# Patient Record
Sex: Female | Born: 1970 | Race: White | Hispanic: No | Marital: Married | Smoking: Never smoker
Health system: Southern US, Community
[De-identification: ages and names within clinical notes are randomized; demographics above are authoritative.]

## PROBLEM LIST (undated history)

## (undated) DIAGNOSIS — F32A Depression, unspecified: Secondary | ICD-10-CM

## (undated) DIAGNOSIS — K449 Diaphragmatic hernia without obstruction or gangrene: Secondary | ICD-10-CM

## (undated) DIAGNOSIS — Z8719 Personal history of other diseases of the digestive system: Secondary | ICD-10-CM

## (undated) DIAGNOSIS — B001 Herpesviral vesicular dermatitis: Secondary | ICD-10-CM

## (undated) DIAGNOSIS — K222 Esophageal obstruction: Secondary | ICD-10-CM

## (undated) DIAGNOSIS — J189 Pneumonia, unspecified organism: Secondary | ICD-10-CM

## (undated) DIAGNOSIS — K219 Gastro-esophageal reflux disease without esophagitis: Secondary | ICD-10-CM

## (undated) DIAGNOSIS — T7840XA Allergy, unspecified, initial encounter: Secondary | ICD-10-CM

## (undated) DIAGNOSIS — Z9889 Other specified postprocedural states: Secondary | ICD-10-CM

## (undated) DIAGNOSIS — I10 Essential (primary) hypertension: Secondary | ICD-10-CM

## (undated) DIAGNOSIS — M1990 Unspecified osteoarthritis, unspecified site: Secondary | ICD-10-CM

## (undated) HISTORY — PX: KNEE ARTHROSCOPY: SHX127

## (undated) HISTORY — DX: Unspecified osteoarthritis, unspecified site: M19.90

## (undated) HISTORY — DX: Essential (primary) hypertension: I10

## (undated) HISTORY — DX: Diaphragmatic hernia without obstruction or gangrene: K44.9

## (undated) HISTORY — DX: Personal history of other diseases of the digestive system: Z87.19

## (undated) HISTORY — DX: Pneumonia, unspecified organism: J18.9

## (undated) HISTORY — DX: Allergy, unspecified, initial encounter: T78.40XA

## (undated) HISTORY — DX: Depression, unspecified: F32.A

## (undated) HISTORY — DX: Other specified postprocedural states: Z98.890

## (undated) HISTORY — DX: Gastro-esophageal reflux disease without esophagitis: K21.9

## (undated) HISTORY — DX: Esophageal obstruction: K22.2

## (undated) HISTORY — DX: Herpesviral vesicular dermatitis: B00.1

---

## 1996-10-09 HISTORY — PX: BLADDER REPAIR: SHX76

## 1997-10-09 HISTORY — PX: BREAST SURGERY: SHX581

## 1998-04-13 ENCOUNTER — Other Ambulatory Visit: Admission: RE | Admit: 1998-04-13 | Discharge: 1998-04-13 | Payer: Self-pay | Admitting: Obstetrics and Gynecology

## 1998-08-11 ENCOUNTER — Encounter: Payer: Self-pay | Admitting: Emergency Medicine

## 1998-08-11 ENCOUNTER — Observation Stay (HOSPITAL_COMMUNITY): Admission: EM | Admit: 1998-08-11 | Discharge: 1998-08-12 | Payer: Self-pay | Admitting: Emergency Medicine

## 1998-08-11 ENCOUNTER — Inpatient Hospital Stay (HOSPITAL_COMMUNITY): Admission: RE | Admit: 1998-08-11 | Discharge: 1998-08-11 | Payer: Self-pay | Admitting: Obstetrics

## 1998-09-07 ENCOUNTER — Inpatient Hospital Stay (HOSPITAL_COMMUNITY): Admission: RE | Admit: 1998-09-07 | Discharge: 1998-09-10 | Payer: Self-pay | Admitting: Obstetrics and Gynecology

## 1998-10-09 HISTORY — PX: OTHER SURGICAL HISTORY: SHX169

## 1998-10-18 ENCOUNTER — Other Ambulatory Visit: Admission: RE | Admit: 1998-10-18 | Discharge: 1998-10-18 | Payer: Self-pay | Admitting: Obstetrics and Gynecology

## 1999-08-18 ENCOUNTER — Emergency Department (HOSPITAL_COMMUNITY): Admission: EM | Admit: 1999-08-18 | Discharge: 1999-08-18 | Payer: Self-pay | Admitting: Emergency Medicine

## 1999-10-21 ENCOUNTER — Other Ambulatory Visit: Admission: RE | Admit: 1999-10-21 | Discharge: 1999-10-21 | Payer: Self-pay | Admitting: Obstetrics and Gynecology

## 2001-04-05 ENCOUNTER — Other Ambulatory Visit: Admission: RE | Admit: 2001-04-05 | Discharge: 2001-04-05 | Payer: Self-pay | Admitting: Obstetrics and Gynecology

## 2002-05-30 ENCOUNTER — Other Ambulatory Visit: Admission: RE | Admit: 2002-05-30 | Discharge: 2002-05-30 | Payer: Self-pay | Admitting: Obstetrics and Gynecology

## 2003-06-23 ENCOUNTER — Other Ambulatory Visit: Admission: RE | Admit: 2003-06-23 | Discharge: 2003-06-23 | Payer: Self-pay | Admitting: Obstetrics and Gynecology

## 2003-11-26 ENCOUNTER — Encounter: Admission: RE | Admit: 2003-11-26 | Discharge: 2003-11-26 | Payer: Self-pay | Admitting: Allergy and Immunology

## 2004-08-12 ENCOUNTER — Other Ambulatory Visit: Admission: RE | Admit: 2004-08-12 | Discharge: 2004-08-12 | Payer: Self-pay | Admitting: Obstetrics and Gynecology

## 2005-07-30 ENCOUNTER — Encounter: Admission: RE | Admit: 2005-07-30 | Discharge: 2005-07-30 | Payer: Self-pay | Admitting: *Deleted

## 2007-09-06 ENCOUNTER — Ambulatory Visit (HOSPITAL_COMMUNITY): Admission: RE | Admit: 2007-09-06 | Discharge: 2007-09-06 | Payer: Self-pay | Admitting: Obstetrics and Gynecology

## 2008-09-07 ENCOUNTER — Ambulatory Visit (HOSPITAL_COMMUNITY): Admission: RE | Admit: 2008-09-07 | Discharge: 2008-09-07 | Payer: Self-pay | Admitting: Family Medicine

## 2009-09-17 ENCOUNTER — Encounter (INDEPENDENT_AMBULATORY_CARE_PROVIDER_SITE_OTHER): Payer: Self-pay | Admitting: *Deleted

## 2009-11-08 ENCOUNTER — Ambulatory Visit: Payer: Self-pay | Admitting: Gastroenterology

## 2009-11-08 ENCOUNTER — Encounter (INDEPENDENT_AMBULATORY_CARE_PROVIDER_SITE_OTHER): Payer: Self-pay | Admitting: *Deleted

## 2009-11-08 DIAGNOSIS — R1319 Other dysphagia: Secondary | ICD-10-CM

## 2009-11-08 DIAGNOSIS — R142 Eructation: Secondary | ICD-10-CM

## 2009-11-08 DIAGNOSIS — R143 Flatulence: Secondary | ICD-10-CM

## 2009-11-08 DIAGNOSIS — K219 Gastro-esophageal reflux disease without esophagitis: Secondary | ICD-10-CM

## 2009-11-08 DIAGNOSIS — R141 Gas pain: Secondary | ICD-10-CM

## 2009-11-26 ENCOUNTER — Ambulatory Visit: Payer: Self-pay | Admitting: Gastroenterology

## 2009-12-01 ENCOUNTER — Encounter: Payer: Self-pay | Admitting: Gastroenterology

## 2010-11-08 NOTE — Procedures (Signed)
Summary: Upper Endoscopy w/DIL  Patient: [name] Note: All result statuses are Final unless otherwise noted.  Tests: (1) Upper Endoscopy w/DIL (UED)  UED Upper Endoscopy w/DIL                             DONE     [facility]     [address]     [place], [place]  [address]           ENDOSCOPY PROCEDURE REPORT           PATIENT:  Hannah, Estock  MR#:  [id number]     BIRTHDATE:  [birth year], 38 yrs. old  GENDER:  female           ENDOSCOPIST:  Judie Petit T. Russella Dar, MD, Clementeen Graham     REFERRING:  Harold Hedge, MD           PROCEDURE DATE:  11/26/2009     PROCEDURE:  EGD with dilatation over guidewire, and with biopsy     ASA CLASS:  Class I     INDICATIONS:  1) dysphagia  2) GERD           MEDICATIONS:  Fentanyl 75 mcg IV, Versed 9 mg IV, Benadryl 25 mg     IV     TOPICAL ANESTHETIC:  Exactacain Spray           DESCRIPTION OF PROCEDURE:   After the risks benefits and     alternatives of the procedure were thoroughly explained, informed     consent was obtained.  The [id number]-H180 [id number] endoscope was     introduced through the mouth and advanced to the second portion of     the duodenum, without limitations.  The instrument was slowly     withdrawn as the mucosa was carefully examined.     <<PROCEDUREIMAGES>>           A stricture was found in the distal esophagus. It was     circumferential and benign appearing. Multiple biopsies were     obtained and sent to pathology.  The esophagus and     gastroesophageal junction were otherwise normal in appearance. The     stomach was entered and closely examined. The pylorus, antrum,     angularis, and lesser curvature were well visualized, including a     retroflexed view of the cardia and fundus. A small hiatal hernia     was noted on retroflexed view. The stomach wall was normally     distensable. The scope passed easily through the pylorus into the     duodenum. The duodenal bulb was normal in appearance, as was the  postbulbar duodenum.  Dilation was then performed at the distal     esophagus:           1) Dilator:  Savary over guidewire  Sizes:  14, 15, 16     Resistance:  minimal  Heme:  yes, minimal amount on first two     dilators           COMPLICATIONS:  None           ENDOSCOPIC IMPRESSION:     1) Stricture in the distal esophagus     2) Small hiatal hernia           RECOMMENDATIONS:     1) await pathology results     2) anti-reflux regimen     3) continue  PPI     4) call office to schedule an office visit for  1 month     5) post dilation instructions           Nil Bolser T. Russella Dar, MD, Clementeen Graham           CC:  Harold Hedge, M.D.           n.     eSIGNED:   Venita Lick. Johneisha Broaden at 11/26/2009 02:48 PM           Hannah Buchanan, [id number]  Note: An exclamation mark (!) indicates a result that was not dispersed into the flowsheet. Document Creation Date: 11/26/2009 2:48 PM _______________________________________________________________________  (1) Order result status: Final Collection or observation date-time: 11/26/2009 14:42 Requested date-time:  Receipt date-time:  Reported date-time:  Referring Physician:   Ordering Physician: Claudette Head [phone]) Specimen Source:  Source: Hannah Buchanan Order Number: [id number] Lab site:

## 2010-11-08 NOTE — Assessment & Plan Note (Signed)
Summary: ABDOMINAL BLOATING-CH   History of Present Illness Visit Type: Initial Consult Primary GI MD: Elie Goody MD [facility] Primary Provider: Harold Hedge, MD Requesting Provider: Harold Hedge, MD Chief Complaint: abdominal bloating after eating with abdominal pain, trouble swallowing solids and liquids History of Present Illness:   This is a 40 year old female here today with her husband. She relates a 14 year history of frequent reflux symptoms with intermittent solid dysphagia. Her reflux symptoms have responded to the use of Nexium and recur when Nexium is stopped.  Her solid food dysphagia has been intermittent over many years and does not seem to have progressed in frequency, or severity. Over the past several months. She has had significant postprandial abdominal bloating and abdominal discomfort. She has not noted any medication and diet changes. She has mild constipation and occasional takes a laxative. Her constipation does not correlate with her abdominal bloating.   GI Review of Systems    Reports abdominal pain, bloating, dysphagia with solids, and  weight gain.     Location of  Abdominal pain: generalized.    Denies acid reflux, belching, chest pain, dysphagia with liquids, heartburn, loss of appetite, nausea, vomiting, vomiting blood, and  weight loss.      Reports constipation.     Denies anal fissure, black tarry stools, change in bowel habit, diarrhea, diverticulosis, fecal incontinence, heme positive stool, hemorrhoids, irritable bowel syndrome, jaundice, light color stool, liver problems, rectal bleeding, and  rectal pain.   Current Medications (verified): 1)  Claritin 10 Mg Tabs (Loratadine) .Marland Kitchen.. 1 Tablet By Mouth Once Daily 2)  Nexium 40 Mg Cpdr (Esomeprazole Magnesium) .Marland Kitchen.. 1 Capsule By Mouth Once Daily 3)  Stool Softener 100 Mg Caps (Docusate Sodium) .... Take As Needed 4)  Valtrex 500 Mg Tabs (Valacyclovir Hcl) .... Take As Needed 5)  Advil 200 Mg Tabs  (Ibuprofen) .... Take As Needed 6)  Robaxin 500 Mg Tabs (Methocarbamol) .... Take As Needed  Allergies (verified): No Known Drug Allergies  Past History:  Past Medical History: Migraine headaches Asthma Obesity GERD  Past Surgical History: Bladder Tack Mammoplasty  Family History: Family History of Uterine Cancer: Maternal Grandmother Family History of Esophageal Cancer:Maternal Uncle Thyroid Cancer-Maternal Aunt Family History of Colon Polyps: Father Family History of Diabetes:Father  Social History: Married  1 boy 1 girl Patient has never smoked.  Alcohol Use - yes Illicit Drug Use - no Daily Caffeine Use  1 perday  Review of Systems       The patient complains of allergy/sinus, back pain, fatigue, headaches-new, muscle pains/cramps, shortness of breath, urination changes/pain, and urine leakage.         The pertinent positives and negatives are noted as above and in the HPI. All other ROS were reviewed and were negative.   Vital Signs:  Patient profile:   40 year old female Height:      65 inches Weight:      231.50 pounds BMI:     38.66 Pulse rate:   76 / minute Pulse rhythm:   regular BP sitting:   116 / 72  (right arm)  Vitals Entered By: Milford Cage NCMA (November 08, 2009 3:18 PM)  Physical Exam  General:  Well developed, well nourished, no acute distress. obese.   Head:  Normocephalic and atraumatic. Eyes:  PERRLA, no icterus. Ears:  Normal auditory acuity. Mouth:  No deformity or lesions, dentition normal. Neck:  Supple; no masses or thyromegaly. Lungs:  Clear throughout to auscultation. Heart:  Regular rate and rhythm; no murmurs, rubs,  or bruits. Abdomen:  Soft, nontender and nondistended. No masses, hepatosplenomegaly or hernias noted. Normal bowel sounds. Msk:  Symmetrical with no gross deformities. Normal posture. Pulses:  Normal pulses noted. Extremities:  No clubbing, cyanosis, edema or deformities noted. Neurologic:  Alert and   oriented x4;  grossly normal neurologically. Skin:  Intact without significant lesions or rashes. Cervical Nodes:  No significant cervical adenopathy. Inguinal Nodes:  No significant inguinal adenopathy. Psych:  Alert and cooperative. Normal mood and affect.  Impression & Recommendations:  Problem # 1:  GERD (ICD-530.81) Chronic reflux symptoms, rule out esophagitis and Barrett's esophagus. The risks, benefits and alternatives to endoscopy with possible biopsy and possible dilation were discussed with the patient and they consent to proceed. The procedure will be scheduled electively. After completing her current supply of Nexium, begin omeprazole 40 mg q.a.m. Orders: EGD SAV (EGD SAV)  Problem # 2:  OTHER DYSPHAGIA (ICD-787.29) Intermittent solid food dysphagia. I suspect she has an esophageal stricture. Upper endoscopy with possible dilation as above. Orders: EGD SAV (EGD SAV)  Problem # 3:  FLATULENCE ERUCTATION AND GAS PAIN (ICD-787.3) Recurrent postprandial abdominal bloating. Etiology unclear. Further evaluation with upper endoscopy. Trial of a low gas diet and a daily probiotic. Consider evaluation for gastric paracentesis and cholelithiasis. Consider a trial of antibiotics for possible bacterial overgrowth. Orders: EGD SAV (EGD SAV)  Patient Instructions: 1)  Conscious Sedation brochure given.  2)  Upper Endoscopy with Dilatation brochure given.  3)  Pick up your prescription at your pharmacy.  4)  Avoid foods high in acid content ( tomatoes, citrus juices, spicy foods) . Avoid eating within 3 to 4 hours of lying down or before exercising. Do not over eat; try smaller more frequent meals. Elevate head of bed four inches when sleeping.  5)  Excessive Gas Diet handout given.  6)  Start Align one tablet by mouth once daily x 1 month.  7)  Copy sent to : Harold Hedge, MD 8)  The medication list was reviewed and reconciled.  All changed / newly prescribed medications were  explained.  A complete medication list was provided to the patient / caregiver.  Prescriptions: OMEPRAZOLE 40 MG CPDR (OMEPRAZOLE) one tablet by mouth once daily  #34 x 11   Entered by:   Christie Nottingham CMA (AAMA)   Authorized by:   Meryl Dare MD [facility]   Signed by:   Meryl Dare MD [facility] on 11/08/2009   Method used:   Electronically to        [facility] (retail)       [address]       [place], [place]  [address]       Ph: [phone]       Fax: [phone]   RxID:   [id number]

## 2010-11-08 NOTE — Letter (Signed)
Summary: EGD Instructions  [facility]  [address] [place], [place] [address]   Phone: [phone]  Fax: [phone]       Hannah Buchanan    1971-04-26    MRN: [id number]       Procedure Day /Date: Friday February 18th, 2011     Arrival Time:  1:00pm     Procedure Time: 2:00pm     Location of Procedure:                    _ x _ [facility] (4th Floor)    PREPARATION FOR ENDOSCOPY   On 11/26/09  THE DAY OF THE PROCEDURE:  1.   No solid foods, milk or milk products are allowed after midnight the night before your procedure.  2.   Do not drink anything colored red or purple.  Avoid juices with pulp.  No orange juice.  3.  You may drink clear liquids until 12:00noon, which is 2 hours before your procedure.                                                                                                CLEAR LIQUIDS INCLUDE: Water Jello Ice Popsicles Tea (sugar ok, no milk/cream) Powdered fruit flavored drinks Coffee (sugar ok, no milk/cream) Gatorade Juice: apple, white grape, white cranberry  Lemonade Clear bullion, consomm, broth Carbonated beverages (any kind) Strained chicken noodle soup Hard Candy   MEDICATION INSTRUCTIONS  Unless otherwise instructed, you should take regular prescription medications with a small sip of water as early as possible the morning of your procedure.          OTHER INSTRUCTIONS  You will need a responsible adult at least 41 years of age to accompany you and drive you home.   This person must remain in the waiting room during your procedure.  Wear loose fitting clothing that is easily removed.  Leave jewelry and other valuables at home.  However, you may wish to bring a book to read or an iPod/MP3 player to listen to music as you wait for your procedure to start.  Remove all body piercing jewelry and leave at home.  Total time from sign-in until discharge is approximately 2-3 hours.  You should go  home directly after your procedure and rest.  You can resume normal activities the day after your procedure.  The day of your procedure you should not:   Drive   Make legal decisions   Operate machinery   Drink alcohol   Return to work  You will receive specific instructions about eating, activities and medications before you leave.    The above instructions have been reviewed and explained to me by   Marchelle Folks.     I fully understand and can verbalize these instructions _____________________________ Date _________

## 2010-11-08 NOTE — Letter (Signed)
Summary: [facility] Biopsy Results  [facility]  [address] [place], [place] [address]   Phone: [phone]  Fax: [phone]        December 01, 2009 MRN: [id number]    [facility] [address] [place], [place]  [address]    Dear Ms. Amero,  I am pleased to inform you that the biopsies taken during your recent endoscopic examination did not show any evidence of cancer upon pathologic examination. The biopsies showed mild inflammation.   Continue with the treatment plan as outlined on the day of your      exam.  Please call us if you are having persistent problems or have questions about your condition that have not been fully answered at this time.  Sincerely,  Meryl Dare MD [facility]  This letter has been electronically signed by your physician.  Appended Document: Patient Notice-Endo Biopsy Results  Letter mailed 2.24.11

## 2010-11-30 ENCOUNTER — Encounter: Payer: Self-pay | Admitting: Gastroenterology

## 2010-12-06 NOTE — Miscellaneous (Signed)
Summary: Omeprazole refill  Clinical Lists Changes  Medications: Changed medication from OMEPRAZOLE 40 MG CPDR (OMEPRAZOLE) one tablet by mouth once daily to OMEPRAZOLE 40 MG CPDR (OMEPRAZOLE) one tablet by mouth once daily NEEDS OFFICE VISIT FOR ANY FURTHER REFILLS!! - Signed Rx of OMEPRAZOLE 40 MG CPDR (OMEPRAZOLE) one tablet by mouth once daily NEEDS OFFICE VISIT FOR ANY FURTHER REFILLS!!;  #30 x 0;  Signed;  Entered by: Christie Nottingham CMA (AAMA);  Authorized by: Meryl Dare MD [facility];  Method used: Electronically to [facility] [address], [place], [place], [place]  [address], Ph: [phone], Fax: [phone]    Prescriptions: OMEPRAZOLE 40 MG CPDR (OMEPRAZOLE) one tablet by mouth once daily NEEDS OFFICE VISIT FOR ANY FURTHER REFILLS!!  #30 x 0   Entered by:   Christie Nottingham CMA (AAMA)   Authorized by:   Meryl Dare MD [facility]   Signed by:   Christie Nottingham CMA (AAMA) on 11/30/2010   Method used:   Electronically to        [facility] (retail)       [address]       [place], [place]  [address]       Ph: [phone]       Fax: [phone]   RxID:   [id number]

## 2011-11-21 ENCOUNTER — Ambulatory Visit: Payer: BC Managed Care – PPO

## 2011-11-21 ENCOUNTER — Ambulatory Visit (INDEPENDENT_AMBULATORY_CARE_PROVIDER_SITE_OTHER): Payer: BC Managed Care – PPO | Admitting: Family Medicine

## 2011-11-21 VITALS — BP 146/90 | HR 93 | Temp 98.2°F | Resp 18 | Ht 64.0 in | Wt 238.8 lb

## 2011-11-21 DIAGNOSIS — R062 Wheezing: Secondary | ICD-10-CM

## 2011-11-21 DIAGNOSIS — J209 Acute bronchitis, unspecified: Secondary | ICD-10-CM

## 2011-11-21 DIAGNOSIS — R05 Cough: Secondary | ICD-10-CM

## 2011-11-21 DIAGNOSIS — J9801 Acute bronchospasm: Secondary | ICD-10-CM

## 2011-11-21 LAB — GLUCOSE, POCT (MANUAL RESULT ENTRY): POC Glucose: 86

## 2011-11-21 LAB — POCT CBC
Lymph, poc: 2.2 (ref 0.6–3.4)
MCH, POC: 28.9 pg (ref 27–31.2)
MCHC: 32.1 g/dL (ref 31.8–35.4)
MPV: 11.3 fL (ref 0–99.8)
POC Granulocyte: 3.5 (ref 2–6.9)
POC LYMPH PERCENT: 35.6 %L (ref 10–50)
POC MID %: 6.5 %M (ref 0–12)
RDW, POC: 14.3 %

## 2011-11-21 MED ORDER — PROMETHAZINE-DM 6.25-15 MG/5ML PO SYRP: ORAL_SOLUTION | ORAL | Status: DC

## 2011-11-21 MED ORDER — METHYLPREDNISOLONE ACETATE 80 MG/ML IJ SUSP
80.0000 mg | Freq: Once | INTRAMUSCULAR | Status: AC
  Administered 2011-11-21: 80 mg via INTRAMUSCULAR

## 2011-11-21 MED ORDER — AZITHROMYCIN 250 MG PO TABS: ORAL_TABLET | ORAL | Status: AC

## 2011-11-21 MED ORDER — ALBUTEROL SULFATE (2.5 MG/3ML) 0.083% IN NEBU
2.5000 mg | INHALATION_SOLUTION | Freq: Once | RESPIRATORY_TRACT | Status: AC
  Administered 2011-11-21: 2.5 mg via RESPIRATORY_TRACT

## 2011-11-21 MED ORDER — ALBUTEROL SULFATE HFA 108 (90 BASE) MCG/ACT IN AERS: 2.0000 | INHALATION_SPRAY | Freq: Four times a day (QID) | RESPIRATORY_TRACT | Status: DC | PRN

## 2011-11-21 NOTE — Patient Instructions (Addendum)
Fluids, rest.  If not improving recheck in 2 days, sooner if worse.  Advised bone density, calcium, and vit D.  PATIENT was advised to check BP out of office when well, and RTC if still high.

## 2011-11-21 NOTE — Progress Notes (Signed)
  Subjective:    Patient ID: Hannah Buchanan, female    DOB: 12-Feb-1971, 41 y.o.   MRN: [id number]  HPI  No primary provider on file.  41 y.o. y/o CF presents with Cough and wheezing  Onset 3 days ago Location  Chest and sinuses Duration 3-4 days Severity moderate Quality/Character-persisitent Exacerbating/Alleviating Factors Cleaning a moldy room made it worse. Associated Signs and Symptoms wheezing, fatigue. No h/o breathing problems.   Health Maintenance: Dr. Owens Shark   Review of Systems  All other systems reviewed and are negative.       Objective:   Physical Exam  Nursing note and vitals reviewed. Constitutional: She is oriented to person, place, and time. She appears well-developed and well-nourished.  HENT:  Head: Normocephalic and atraumatic.  Right Ear: External ear normal.  Left Ear: External ear normal.  Nose: Nose normal.  Mouth/Throat: Oropharynx is clear and moist. No oropharyngeal exudate.  Neck: Normal range of motion. Neck supple.  Cardiovascular: Normal rate, regular rhythm and intact distal pulses.  Exam reveals no gallop and no friction rub.   No murmur heard. Pulmonary/Chest: Effort normal. No respiratory distress. She has wheezes (on expiration). She has no rales.  Lymphadenopathy:    She has no cervical adenopathy.  Neurological: She is alert and oriented to person, place, and time.  Skin: Skin is warm and dry.         [facility] reading (PRIMARY) by  Dr. Perfecto Kingdom markings bilaterally with no infiltrate.  Kyphosis  Results for orders placed in visit on 11/21/11  POCT CBC      Component Value Range   WBC 6.1  4.6 - 10.2 (K/uL)   Lymph, poc 2.2  0.6 - 3.4    POC LYMPH PERCENT 35.6  10 - 50 (%L)   MID (cbc) 0.4  0 - 0.9    POC MID % 6.5  0 - 12 (%M)   POC Granulocyte 3.5  2 - 6.9    Granulocyte percent 57.9  37 - 80 (%G)   RBC 4.71  4.04 - 5.48 (M/uL)   Hemoglobin 13.6  12.2 - 16.2 (g/dL)   HCT, POC 40.9  81.1 - 47.9 (%)   MCV 90.0   80 - 97 (fL)   MCH, POC 28.9  27 - 31.2 (pg)   MCHC 32.1  31.8 - 35.4 (g/dL)   RDW, POC 91.4     Platelet Count, POC 192  142 - 424 (K/uL)   MPV 11.3  0 - 99.8 (fL)  GLUCOSE, POCT (MANUAL RESULT ENTRY)      Component Value Range   POC Glucose 86      Assessment & Plan:  URI:  Possibly viral, but will cover for atypicals due to congestion on xray

## 2012-02-20 ENCOUNTER — Ambulatory Visit: Payer: BC Managed Care – PPO

## 2012-02-20 ENCOUNTER — Ambulatory Visit: Payer: Self-pay | Admitting: Family Medicine

## 2012-02-20 DIAGNOSIS — R079 Chest pain, unspecified: Secondary | ICD-10-CM

## 2012-02-20 NOTE — Progress Notes (Signed)
  Subjective:    Patient ID: Hannah Buchanan, female    DOB: 1971/01/16, 41 y.o.   MRN: [id number]  HPI 41 yo female with allergies, asthma, and GERD here for chest pain for 3 days.  Sharp/burning pain under left breast, left upper chest, left neck.  Also, feeling of pressure across upper chest bilaterally.  Pains improve some with aspirin.  Worse with bending over.   Today woke up with bad headache that made her nauseated.  Took aspirin again and headache abated to tolerable level and nausea went away.    Has been trying to increase exercise lately and notices that her heart starts to beat very fast and very hard and she feels short of breath.  Feels this is because she is obese.  No true pain or pressure with the exercising.   Took BP at work today and was 150/100.     Review of Systems Negative except as per HPI     Objective:   Physical Exam  Constitutional: Vital signs are normal. She appears well-developed and well-nourished. She is active.  Cardiovascular: Normal rate, regular rhythm, normal heart sounds and normal pulses.   Pulmonary/Chest: Effort normal and breath sounds normal.  Abdominal: Soft. Normal appearance and bowel sounds are normal. She exhibits no distension and no mass. There is no hepatosplenomegaly. There is no tenderness. There is no rigidity, no rebound, no guarding, no CVA tenderness, no tenderness at McBurney's point and negative Murphy's sign. No hernia.  Neurological: She is alert.    EKG: NSR, normal intervals, normal St-T waves      Assessment & Plan:  Chest pain - atypical but still somewhat concerning.  Warrants cardiology eval this week.  Discussed signs and symptoms that should prompt her to go right to ED.  Send to Dr. Jacinto Halim.

## 2012-12-17 ENCOUNTER — Encounter: Payer: Self-pay | Admitting: Gastroenterology

## 2013-01-13 ENCOUNTER — Encounter: Payer: Self-pay | Admitting: Gastroenterology

## 2013-01-13 ENCOUNTER — Other Ambulatory Visit (INDEPENDENT_AMBULATORY_CARE_PROVIDER_SITE_OTHER): Payer: BC Managed Care – PPO

## 2013-01-13 ENCOUNTER — Ambulatory Visit (INDEPENDENT_AMBULATORY_CARE_PROVIDER_SITE_OTHER): Payer: BC Managed Care – PPO | Admitting: Gastroenterology

## 2013-01-13 VITALS — BP 106/80 | HR 88 | Ht 64.0 in | Wt 246.2 lb

## 2013-01-13 DIAGNOSIS — K219 Gastro-esophageal reflux disease without esophagitis: Secondary | ICD-10-CM

## 2013-01-13 DIAGNOSIS — R7401 Elevation of levels of liver transaminase levels: Secondary | ICD-10-CM

## 2013-01-13 LAB — HEPATIC FUNCTION PANEL
Albumin: 4.2 g/dL (ref 3.5–5.2)
Alkaline Phosphatase: 78 U/L (ref 39–117)

## 2013-01-13 LAB — PROTIME-INR: Prothrombin Time: 12.9 s — ABNORMAL HIGH (ref 10.2–12.4)

## 2013-01-13 NOTE — Progress Notes (Signed)
History of Present Illness: This is a 42 year old female who has GERD with a history of a peptic stricture, dilated in 2011. She states her reflux symptoms are controlled with dietary measures and omeprazole 20 or 40 mg daily. She has occasional episodes of mild dysphagia with bread and meats but her symptoms are minimal and generally well controlled with drinking water with meals. Routine blood work by her cardiologist in 11/2012 revealed an mild elevation of her transaminases. AST of 67 and ALT at 83. Her glucose was mildly elevated at 107 cholesterol 201 LDL cholesterol 132. She has no prior history of liver disease. There is no family history of liver disease. She denies jaundice or exposure to individuals with hepatitis. She states she had a patient by her [facility] 15 or 20 years ago and evaluation at that time showed no evidence of hepatitis or any disease communicated to her. She was taking Naprosyn on a daily basis for several weeks prior to this blood work and discontinued Naprosyn after the blood work abnormalities were found. She has taken Aleve and ibuprofen infrequently since that time.  Current Medications, Allergies, Past Medical History, Past Surgical History, Family History and Social History were reviewed in [employer] record.  Physical Exam: General: Well developed , well nourished, obese, no acute distress Head: Normocephalic and atraumatic Eyes:  sclerae anicteric, EOMI Ears: Normal auditory acuity Mouth: No deformity or lesions Lungs: Clear throughout to auscultation Heart: Regular rate and rhythm; no murmurs, rubs or bruits Abdomen: Soft, non tender and non distended. No masses, hepatosplenomegaly or hernias noted. Normal Bowel sounds Musculoskeletal: Symmetrical with no gross deformities  Pulses:  Normal pulses noted Extremities: No clubbing, cyanosis, edema or deformities noted Neurological: Alert oriented x 4, grossly  nonfocal Psychological:  Alert and cooperative. Normal mood and affect  Assessment and Recommendations:  1. GERD with a history of an esophageal stricture. She notes intermittent dysphagia but is not ready to proceed with repeat dilation at this time. If her dysphagia worsens she is advised to contact us. Continue standard antireflux measures and omeprazole 20 mg daily to twice a day.  2. Mildly elevated transaminases. Rule out fatty infiltration of the liver, medication side effects. R/O genetic, acquired, and viral liver diseases. Standard blood work including repeat liver function test. Schedule abdominal ultrasound.  3. Colorectal cancer screening, average risk. Colonoscopy at age 25.

## 2013-01-13 NOTE — Patient Instructions (Addendum)
Your physician has requested that you go to the basement for lab work before leaving today.  You have been scheduled for an abdominal ultrasound at [facility] (1st floor of hospital) on 01/16/13 at 8:30am. Please arrive 15 minutes prior to your appointment for registration. Make certain not to have anything to eat or drink 6 hours prior to your appointment. Should you need to reschedule your appointment, please contact radiology at [phone]. This test typically takes about 30 minutes to perform.  cc: Delrae Rend, MD

## 2013-01-14 LAB — ALPHA-1-ANTITRYPSIN: A-1 Antitrypsin, Ser: 141 mg/dL (ref 90–200)

## 2013-01-14 LAB — CERULOPLASMIN: Ceruloplasmin: 34 mg/dL (ref 20–60)

## 2013-01-15 LAB — MITOCHONDRIAL ANTIBODIES: Mitochondrial M2 Ab, IgG: 0.4 (ref <0.91)

## 2013-01-16 ENCOUNTER — Other Ambulatory Visit: Payer: Self-pay | Admitting: Obstetrics and Gynecology

## 2013-01-16 ENCOUNTER — Other Ambulatory Visit: Payer: Self-pay

## 2013-01-16 ENCOUNTER — Ambulatory Visit (HOSPITAL_COMMUNITY)
Admission: RE | Admit: 2013-01-16 | Discharge: 2013-01-16 | Disposition: A | Discharge status: Home / Self Care | Payer: BC Managed Care – PPO | Source: Ambulatory Visit | Attending: Gastroenterology | Admitting: Gastroenterology

## 2013-01-16 DIAGNOSIS — K7689 Other specified diseases of liver: Secondary | ICD-10-CM | POA: Insufficient documentation

## 2013-01-16 DIAGNOSIS — R748 Abnormal levels of other serum enzymes: Secondary | ICD-10-CM | POA: Insufficient documentation

## 2013-01-16 DIAGNOSIS — R933 Abnormal findings on diagnostic imaging of other parts of digestive tract: Secondary | ICD-10-CM

## 2013-01-23 ENCOUNTER — Telehealth: Payer: Self-pay

## 2013-01-23 ENCOUNTER — Ambulatory Visit
Admission: RE | Admit: 2013-01-23 | Discharge: 2013-01-23 | Disposition: A | Discharge status: Home / Self Care | Payer: BC Managed Care – PPO | Source: Ambulatory Visit | Attending: Gastroenterology | Admitting: Gastroenterology

## 2013-01-23 DIAGNOSIS — R933 Abnormal findings on diagnostic imaging of other parts of digestive tract: Secondary | ICD-10-CM

## 2013-01-23 MED ORDER — GADOXETATE DISODIUM 0.25 MMOL/ML IV SOLN
10.0000 mL | Freq: Once | INTRAVENOUS | Status: AC | PRN
  Administered 2013-01-23: 10 mL via INTRAVENOUS

## 2013-01-23 NOTE — Telephone Encounter (Signed)
Pt is calling back because she has been speaking to someone about Medical Records and a provider. Call back number is [phone]

## 2013-01-23 NOTE — Telephone Encounter (Signed)
Called her, she is having some medical problems, and wishes to establish with Dr Katrinka Blazing or Dr Patsy Lager. I have provided hours for her, when you get medical records for her, please place in my box, so I can get them to Dr Patsy Lager or to Dr Katrinka Blazing.

## 2013-02-04 NOTE — Telephone Encounter (Signed)
Spoke with patient. She is seeing a cardiologist Dr. Jacinto Halim and Dr. Russella Dar at [facility]. She was told by her providers that she needed to list someone as her PCP so she told them Dr. Katrinka Blazing. She just wanted [place] to know in case they send her records. Told her that Dr. Ardell Isaacs office and our office are on the same EMR system but will look out for records from Dr. Jacinto Halim in case they send anything.

## 2013-08-14 ENCOUNTER — Other Ambulatory Visit: Payer: Self-pay

## 2013-11-03 ENCOUNTER — Emergency Department (HOSPITAL_COMMUNITY): Payer: Managed Care, Other (non HMO)

## 2013-11-03 ENCOUNTER — Encounter (HOSPITAL_COMMUNITY): Payer: Self-pay | Admitting: Emergency Medicine

## 2013-11-03 ENCOUNTER — Emergency Department (HOSPITAL_COMMUNITY)
Admission: EM | Admit: 2013-11-03 | Discharge: 2013-11-03 | Disposition: A | Discharge status: Home / Self Care | Payer: Managed Care, Other (non HMO) | Attending: Emergency Medicine | Admitting: Emergency Medicine

## 2013-11-03 DIAGNOSIS — Y92410 Unspecified street and highway as the place of occurrence of the external cause: Secondary | ICD-10-CM | POA: Insufficient documentation

## 2013-11-03 DIAGNOSIS — K219 Gastro-esophageal reflux disease without esophagitis: Secondary | ICD-10-CM | POA: Insufficient documentation

## 2013-11-03 DIAGNOSIS — R071 Chest pain on breathing: Secondary | ICD-10-CM | POA: Insufficient documentation

## 2013-11-03 DIAGNOSIS — Y9389 Activity, other specified: Secondary | ICD-10-CM | POA: Insufficient documentation

## 2013-11-03 DIAGNOSIS — I10 Essential (primary) hypertension: Secondary | ICD-10-CM | POA: Insufficient documentation

## 2013-11-03 DIAGNOSIS — IMO0002 Reserved for concepts with insufficient information to code with codable children: Secondary | ICD-10-CM | POA: Insufficient documentation

## 2013-11-03 DIAGNOSIS — Z79899 Other long term (current) drug therapy: Secondary | ICD-10-CM | POA: Insufficient documentation

## 2013-11-03 DIAGNOSIS — Z791 Long term (current) use of non-steroidal anti-inflammatories (NSAID): Secondary | ICD-10-CM | POA: Insufficient documentation

## 2013-11-03 DIAGNOSIS — R0789 Other chest pain: Secondary | ICD-10-CM

## 2013-11-03 MED ORDER — HYDROCODONE-ACETAMINOPHEN 5-325 MG PO TABS
1.0000 | ORAL_TABLET | Freq: Once | ORAL | Status: AC
Start: 2013-11-03 — End: 2013-11-03
  Administered 2013-11-03: 1 via ORAL
  Filled 2013-11-03: qty 1

## 2013-11-03 MED ORDER — KETOROLAC TROMETHAMINE 30 MG/ML IJ SOLN
30.0000 mg | Freq: Once | INTRAMUSCULAR | Status: AC
  Administered 2013-11-03: 30 mg via INTRAVENOUS
  Filled 2013-11-03: qty 1

## 2013-11-03 MED ORDER — HYDROCODONE-ACETAMINOPHEN 5-325 MG PO TABS: 1.0000 | ORAL_TABLET | Freq: Four times a day (QID) | ORAL | Status: DC | PRN

## 2013-11-03 MED ORDER — IBUPROFEN 600 MG PO TABS: 600.0000 mg | ORAL_TABLET | Freq: Four times a day (QID) | ORAL | Status: DC | PRN
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
## 2013-11-03 NOTE — ED Notes (Signed)
Pt ambulating in room without difficulty; drinking water
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
## 2013-11-03 NOTE — ED Notes (Signed)
Bed: [id number] Expected date:  Expected time:  Means of arrival:  Comments: ems
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
## 2013-11-11 ENCOUNTER — Ambulatory Visit: Payer: Managed Care, Other (non HMO)

## 2013-11-11 ENCOUNTER — Ambulatory Visit (INDEPENDENT_AMBULATORY_CARE_PROVIDER_SITE_OTHER): Payer: Managed Care, Other (non HMO) | Admitting: Emergency Medicine

## 2013-11-11 ENCOUNTER — Inpatient Hospital Stay
Admission: RE | Admit: 2013-11-11 | Discharge: 2013-11-11 | Disposition: A | Discharge status: Home / Self Care | Payer: Self-pay | Source: Ambulatory Visit | Attending: Emergency Medicine | Admitting: Emergency Medicine

## 2013-11-11 VITALS — BP 134/80 | HR 73 | Temp 98.2°F | Resp 18 | Ht 64.0 in | Wt 237.0 lb

## 2013-11-11 DIAGNOSIS — R079 Chest pain, unspecified: Secondary | ICD-10-CM

## 2013-11-11 DIAGNOSIS — S0510XA Contusion of eyeball and orbital tissues, unspecified eye, initial encounter: Secondary | ICD-10-CM

## 2013-11-11 DIAGNOSIS — T148XXA Other injury of unspecified body region, initial encounter: Secondary | ICD-10-CM

## 2013-11-11 DIAGNOSIS — S20219A Contusion of unspecified front wall of thorax, initial encounter: Secondary | ICD-10-CM

## 2013-11-11 NOTE — Patient Instructions (Signed)
Rib Contusion A rib contusion (bruise) can occur by a blow to the chest or by a fall against a hard object. Usually these will be much better in a couple weeks. If X-rays were taken today and there are no broken bones (fractures), the diagnosis of bruising is made. However, broken ribs may not show up for several days, or may be discovered later on a routine X-ray when signs of healing show up. If this happens to you, it does not mean that something was missed on the X-ray, but simply that it did not show up on the first X-rays. Earlier diagnosis will not usually change the treatment. HOME CARE INSTRUCTIONS   Avoid strenuous activity. Be careful during activities and avoid bumping the injured ribs. Activities that pull on the injured ribs and cause pain should be avoided, if possible.  For the first day or two, an ice pack used every 20 minutes while awake may be helpful. Put ice in a plastic bag and put a towel between the bag and the skin.  Eat a normal, well-balanced diet. Drink plenty of fluids to avoid constipation.  Take deep breaths several times a day to keep lungs free of infection. Try to cough several times a day. Splint the injured area with a pillow while coughing to ease pain. Coughing can help prevent pneumonia.  Wear a rib belt or binder only if told to do so by your caregiver. If you are wearing a rib belt or binder, you must do the breathing exercises as directed by your caregiver. If not used properly, rib belts or binders restrict breathing which can lead to pneumonia.  Only take over-the-counter or prescription medicines for pain, discomfort, or fever as directed by your caregiver. SEEK MEDICAL CARE IF:   You or your child has an oral temperature above 102 F (38.9 C).  Your baby is older than 3 months with a rectal temperature of 100.5 F (38.1 C) or higher for more than 1 day.  You develop a cough, with thick or bloody sputum. SEEK IMMEDIATE MEDICAL CARE IF:   You  have difficulty breathing.  You feel sick to your stomach (nausea), have vomiting or belly (abdominal) pain.  You have worsening pain, not controlled with medications, or there is a change in the location of the pain.  You develop sweating or radiation of the pain into the arms, jaw or shoulders, or become light headed or faint.  You or your child has an oral temperature above 102 F (38.9 C), not controlled by medicine.  Your or your baby is older than 3 months with a rectal temperature of 102 F (38.9 C) or higher.  Your baby is 3 months old or younger with a rectal temperature of 100.4 F (38 C) or higher. MAKE SURE YOU:   Understand these instructions.  Will watch your condition.  Will get help right away if you are not doing well or get worse. Document Released: 06/20/2001 Document Revised: 01/20/2013 Document Reviewed: 05/13/2008 ExitCare Patient Information 2014 [employer].  
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
## 2013-12-09 ENCOUNTER — Ambulatory Visit (INDEPENDENT_AMBULATORY_CARE_PROVIDER_SITE_OTHER): Payer: Managed Care, Other (non HMO) | Admitting: Emergency Medicine

## 2013-12-09 VITALS — BP 108/80 | HR 91 | Temp 99.1°F | Resp 18 | Wt 237.0 lb

## 2013-12-09 DIAGNOSIS — J209 Acute bronchitis, unspecified: Secondary | ICD-10-CM

## 2013-12-09 DIAGNOSIS — J018 Other acute sinusitis: Secondary | ICD-10-CM

## 2013-12-09 MED ORDER — PSEUDOEPHEDRINE-GUAIFENESIN ER 60-600 MG PO TB12: 1.0000 | ORAL_TABLET | Freq: Two times a day (BID) | ORAL | Status: DC

## 2013-12-09 MED ORDER — PROMETHAZINE-CODEINE 6.25-10 MG/5ML PO SYRP: 5.0000 mL | ORAL_SOLUTION | Freq: Four times a day (QID) | ORAL | Status: DC | PRN

## 2013-12-09 MED ORDER — AMOXICILLIN-POT CLAVULANATE 875-125 MG PO TABS: 1.0000 | ORAL_TABLET | Freq: Two times a day (BID) | ORAL | Status: DC

## 2013-12-09 NOTE — Patient Instructions (Signed)

## 2013-12-09 NOTE — Progress Notes (Signed)
[facility] [address], [place] [address] [phone]  Date:  12/09/2013   Name:  Hannah Buchanan   DOB:  12/27/1970   MRN:  [id number]  PCP:  Nilda SimmerSMITH,KRISTI, MD    Chief Complaint: chest congestion   History of Present Illness:  Hannah Buchanan is a 43 y.o. very pleasant female patient who presents with the following:  Ill with nasal congestion and post nasal drainage purulent in nature.  Sore throat.  Pressure in cheeks.  Has cough productive purulent sputum associated with some wheezing.  No shortness of breath.  No fever or chills.  History of bronchospasm.  No improvement with over the counter medications or other home remedies. Denies other complaint or health concern today.   Patient Active Problem List   Diagnosis Date Noted  . GERD 11/08/2009  . OTHER DYSPHAGIA 11/08/2009  . FLATULENCE ERUCTATION AND GAS PAIN 11/08/2009    Past Medical History  Diagnosis Date  . GERD (gastroesophageal reflux disease)   . Hypertension   . Status post dilation of esophageal narrowing   . Pneumonia   . Recurrent cold sores     Past Surgical History  Procedure Laterality Date  . Bladder repair  1998  . Breast surgery  1999    breast reduction    History  Substance Use Topics  . Smoking status: Never Smoker   . Smokeless tobacco: Never Used  . Alcohol Use: Yes     Comment: occasional    Family History  Problem Relation Age of Onset  . Uterine cancer Maternal Grandmother   . Thyroid cancer Maternal Aunt   . Cancer Maternal Uncle     unknown origin-Mets  . Colon cancer Father   . Colon polyps Father   . Diabetes Father     No Known Allergies  Medication list has been reviewed and updated.  Current Outpatient Prescriptions on File Prior to Visit  Medication Sig Dispense Refill  . albuterol (PROVENTIL HFA;VENTOLIN HFA) 108 (90 BASE) MCG/ACT inhaler Inhale 2 puffs into the lungs every 6 (six) hours as needed for wheezing.  1 Inhaler  0  .  chlorthalidone (HYGROTON) 25 MG tablet Take 25 mg by mouth as needed.      Marland Kitchen. HYDROcodone-acetaminophen (NORCO/VICODIN) 5-325 MG per tablet Take 1 tablet by mouth every 6 (six) hours as needed for moderate pain.  15 tablet  0  . ibuprofen (ADVIL,MOTRIN) 600 MG tablet Take 1 tablet (600 mg total) by mouth every 6 (six) hours as needed.  30 tablet  0  . loratadine (CLARITIN) 10 MG tablet Take 10 mg by mouth daily.      . metoprolol (LOPRESSOR) 50 MG tablet Take 50 mg by mouth 2 (two) times daily.      . naproxen sodium (ANAPROX) 220 MG tablet Take 440 mg by mouth 2 (two) times daily with a meal.      . nitroGLYCERIN (NITROSTAT) 0.4 MG SL tablet Place 0.4 mg under the tongue every 5 (five) minutes as needed for chest pain.      Marland Kitchen. omeprazole (PRILOSEC) 20 MG capsule Take 20 mg by mouth daily.      . valACYclovir (VALTREX) 500 MG tablet Take 500 mg by mouth as needed.       No current facility-administered medications on file prior to visit.    Review of Systems:  As per HPI, otherwise negative.    Physical Examination: Filed Vitals:   12/09/13 1828  BP:  108/80  Pulse: 91  Temp: 99.1 F (37.3 C)  Resp: 18   Filed Vitals:   12/09/13 1828  Weight: 237 lb (107.502 kg)   Body mass index is 40.66 kg/(m^2). Ideal Body Weight:    GEN: WDWN, NAD, Non-toxic, A & O x 3 HEENT: Atraumatic, Normocephalic. Neck supple. No masses, No LAD. Ears and Nose: No external deformity. CV: RRR, No M/G/R. No JVD. No thrill. No extra heart sounds. PULM: CTA B, no wheezes, crackles, rhonchi. No retractions. No resp. distress. No accessory muscle use. ABD: S, NT, ND, +BS. No rebound. No HSM. EXTR: No c/c/e NEURO Normal gait.  PSYCH: Normally interactive. Conversant. Not depressed or anxious appearing.  Calm demeanor.    Assessment and Plan: Sinusitis Bronchitis augmentin mucinex d Phen c cod Use your MDI  Signed,  Phillips Odor, MD

## 2013-12-11 ENCOUNTER — Other Ambulatory Visit: Payer: Self-pay | Admitting: Physician Assistant

## 2013-12-12 MED ORDER — ALBUTEROL SULFATE HFA 108 (90 BASE) MCG/ACT IN AERS: 2.0000 | INHALATION_SPRAY | Freq: Four times a day (QID) | RESPIRATORY_TRACT | Status: DC | PRN

## 2014-05-14 DIAGNOSIS — Z0271 Encounter for disability determination: Secondary | ICD-10-CM

## 2014-05-18 ENCOUNTER — Encounter: Payer: Self-pay | Admitting: Gastroenterology

## 2014-10-19 ENCOUNTER — Ambulatory Visit: Payer: Managed Care, Other (non HMO) | Admitting: Family Medicine

## 2014-10-26 ENCOUNTER — Ambulatory Visit: Payer: Managed Care, Other (non HMO) | Admitting: Family Medicine

## 2014-11-18 ENCOUNTER — Ambulatory Visit (INDEPENDENT_AMBULATORY_CARE_PROVIDER_SITE_OTHER): Payer: Managed Care, Other (non HMO) | Admitting: Family Medicine

## 2014-11-18 ENCOUNTER — Encounter: Payer: Self-pay | Admitting: Family Medicine

## 2014-11-18 VITALS — BP 120/78 | HR 97 | Temp 98.2°F | Resp 16 | Ht 64.0 in | Wt 235.0 lb

## 2014-11-18 DIAGNOSIS — B372 Candidiasis of skin and nail: Secondary | ICD-10-CM

## 2014-11-18 DIAGNOSIS — I10 Essential (primary) hypertension: Secondary | ICD-10-CM | POA: Insufficient documentation

## 2014-11-18 DIAGNOSIS — K219 Gastro-esophageal reflux disease without esophagitis: Secondary | ICD-10-CM

## 2014-11-18 DIAGNOSIS — Z131 Encounter for screening for diabetes mellitus: Secondary | ICD-10-CM

## 2014-11-18 DIAGNOSIS — R21 Rash and other nonspecific skin eruption: Secondary | ICD-10-CM

## 2014-11-18 DIAGNOSIS — E669 Obesity, unspecified: Secondary | ICD-10-CM

## 2014-11-18 DIAGNOSIS — J301 Allergic rhinitis due to pollen: Secondary | ICD-10-CM

## 2014-11-18 LAB — POCT URINALYSIS DIPSTICK
BILIRUBIN UA: NEGATIVE
Blood, UA: NEGATIVE
GLUCOSE UA: NEGATIVE
KETONES UA: NEGATIVE
Leukocytes, UA: NEGATIVE
Nitrite, UA: NEGATIVE
Protein, UA: NEGATIVE
SPEC GRAV UA: 1.015
UROBILINOGEN UA: 0.2
pH, UA: 6.5

## 2014-11-18 LAB — CBC WITH DIFFERENTIAL/PLATELET
BASOS ABS: 0 10*3/uL (ref 0.0–0.1)
BASOS PCT: 0 % (ref 0–1)
EOS PCT: 3 % (ref 0–5)
Eosinophils Absolute: 0.2 10*3/uL (ref 0.0–0.7)
HEMATOCRIT: 37.1 % (ref 36.0–46.0)
Hemoglobin: 12.4 g/dL (ref 12.0–15.0)
Lymphocytes Relative: 32 % (ref 12–46)
Lymphs Abs: 2 10*3/uL (ref 0.7–4.0)
MCH: 30 pg (ref 26.0–34.0)
MCHC: 33.4 g/dL (ref 30.0–36.0)
MCV: 89.6 fL (ref 78.0–100.0)
MONO ABS: 0.4 10*3/uL (ref 0.1–1.0)
MONOS PCT: 6 % (ref 3–12)
MPV: 11.6 fL (ref 8.6–12.4)
Neutro Abs: 3.8 10*3/uL (ref 1.7–7.7)
Neutrophils Relative %: 59 % (ref 43–77)
PLATELETS: 220 10*3/uL (ref 150–400)
RBC: 4.14 MIL/uL (ref 3.87–5.11)
RDW: 14 % (ref 11.5–15.5)
WBC: 6.4 10*3/uL (ref 4.0–10.5)

## 2014-11-18 LAB — POCT SKIN KOH: Skin KOH, POC: NEGATIVE

## 2014-11-18 MED ORDER — FLUCONAZOLE 150 MG PO TABS: 150.0000 mg | ORAL_TABLET | Freq: Once | ORAL | Status: DC

## 2014-11-18 MED ORDER — METOPROLOL SUCCINATE ER 50 MG PO TB24: 50.0000 mg | ORAL_TABLET | Freq: Every day | ORAL | Status: DC

## 2014-11-18 MED ORDER — NYSTATIN 100000 UNIT/GM EX CREA: 1.0000 "application " | TOPICAL_CREAM | Freq: Two times a day (BID) | CUTANEOUS | Status: DC

## 2014-11-18 NOTE — Progress Notes (Signed)
Subjective:    Patient ID: Hannah Buchanan, female    DOB: 06-18-1971, 44 y.o.   MRN: [id number]  11/18/2014  establish care; Hypertension; and shingles   HPI This 44 y.o. female presents to establish care.    HTN:  S/p  evlauation by Dr. Jacinto HalimGanji two years ago due to headaches.  Prescribed Metoprolol ER; ran out of medication.  Taking Metoprolol Succinate ER 50mg  once daily.  Was having palpitations.  Has been getting refills from Dr. Penni BombardKendall where she works; Dr. Penni Bomb[facility]Kendall [facility]   Allergies: takes Claritin 10mg  daily.  Symptoms well controlled on medication.  GERD: well controlled on Prilosec.  Denies abdominal pain, n/v/d/c; denies bloody stools or melena.  Gluten sensitivity: will have SOB, joint pains.  Aching.  +bloating. Avoiding gluten greatly improves all of these symptoms.   Rash: B groin/legs; concerned about yeast; involves folds.  No medications; +terribly itchy.    Last physical:  Unsure. Pap smear:  11/2013 [place]; Regular menses.  No contraception.   Mammogram:  Current; 2015 TDAP:  current Influenza:  Never; refuses. Eye exam:  Current; +glasses Dental exam:  Every six months.   Review of Systems  Constitutional: Negative for fever, chills, diaphoresis and fatigue.  Eyes: Negative for visual disturbance.  Respiratory: Negative for cough and shortness of breath.   Cardiovascular: Negative for chest pain, palpitations and leg swelling.  Gastrointestinal: Negative for nausea, vomiting, abdominal pain, diarrhea and constipation.  Endocrine: Negative for cold intolerance, heat intolerance, polydipsia, polyphagia and polyuria.  Skin: Positive for rash.  Neurological: Negative for dizziness, tremors, seizures, syncope, facial asymmetry, speech difficulty, weakness, light-headedness, numbness and headaches.    Past Medical History  Diagnosis Date  . GERD (gastroesophageal reflux disease)   . Hypertension   . Status post dilation of esophageal  narrowing   . Pneumonia   . Recurrent cold sores   . Allergy     Claritin daily yearround.   Past Surgical History  Procedure Laterality Date  . Bladder repair  1998    David Lowe/Gynecology.  . Breast surgery  1999    breast reduction  . Admission  10/09/1998    prolonged syncopal event; admission for 24 hours.  [place]   No Known Allergies History   Social History  . Marital Status: Married    Spouse Name: N/A  . Number of Children: 2  . Years of Education: N/A   Occupational History  . medical assistant    Social History Main Topics  . Smoking status: Never Smoker   . Smokeless tobacco: Never Used  . Alcohol Use: Yes     Comment: occasional  . Drug Use: No  . Sexual Activity: Yes   Other Topics Concern  . Not on file   Social History Narrative   Marital status: married x 24 years; happily      Chldren: 2 children (20, 2723); no grandchldren.      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   Family History  Problem Relation Age of Onset  . Uterine cancer Maternal Grandmother   . Cancer Maternal Grandmother   . Thyroid cancer Maternal Aunt   . Cancer Maternal Uncle  unknown origin-Mets  . Colon cancer Father   . Colon polyps Father   . Diabetes Father   . Cancer Father 50    colon cancer  . Arthritis Mother   . Asthma Mother   . Depression Sister   . Asthma Sister   . Migraines Sister   . Alcohol abuse Brother   . Depression Brother         Objective:    BP 120/78 mmHg  Pulse 97  Temp(Src) 98.2 F (36.8 C) (Oral)  Resp 16  Ht  (1.626 m)  Wt 235 lb (106.595 kg)  BMI 40.32 kg/m2  SpO2 98% Physical Exam  Constitutional: She is oriented to person, place, and time. She appears well-developed and well-nourished. No distress.  obese  HENT:  Head: Normocephalic and  atraumatic.  Right Ear: External ear normal.  Left Ear: External ear normal.  Nose: Nose normal.  Mouth/Throat: Oropharynx is clear and moist.  Eyes: Conjunctivae and EOM are normal. Pupils are equal, round, and reactive to light.  Neck: Normal range of motion. Neck supple. Carotid bruit is not present. No thyromegaly present.  Cardiovascular: Normal rate, regular rhythm, normal heart sounds and intact distal pulses.  Exam reveals no gallop and no friction rub.   No murmur heard. Pulmonary/Chest: Effort normal and breath sounds normal. She has no wheezes. She has no rales.  Abdominal: Soft. Bowel sounds are normal. She exhibits no distension and no mass. There is no tenderness. There is no rebound and no guarding.  Lymphadenopathy:    She has no cervical adenopathy.  Neurological: She is alert and oriented to person, place, and time. No cranial nerve deficit.  Skin: Skin is warm and dry. Rash noted. She is not diaphoretic. There is erythema. No pallor.  Diffuse erythematous scaling rash B inguinal folds and extends to proximal thighs; similar rash along abdominal fold.  Psychiatric: She has a normal mood and affect. Her behavior is normal.        Assessment & Plan:   1. Essential hypertension, benign   2. Screening for diabetes mellitus   3. Rash and nonspecific skin eruption   4. Allergic rhinitis due to pollen   5. Gastroesophageal reflux disease without esophagitis   6. Candidiasis, intertriginous   7. Obesity     1. HTN: controlled; obtain labs; refill provided; RTC six months. 2.  Screening DMII: obtain glucose, HgbA1c. 3.  GERD: controlled with Prilosec daily.  Recommend weight loss and dietary modification. 4.  Rash/candidiasis: New. Rx for Nystatin cream to use bid for two weeks; rx for Diflucan also provided. Call in two weeks if not much improved. 5.  Allergic Rhinitis: controlled; continue Claritin daily. 6. Obesity: recommend weight loss, exercise, low-fat and  low-caloric food choices.   Meds ordered this encounter  Medications  . meloxicam (MOBIC) 7.5 MG tablet    Sig: Take 7.5 mg by mouth daily.  Marland Kitchen DISCONTD: metoprolol succinate (TOPROL-XL) 50 MG 24 hr tablet    Sig:   . metoprolol succinate (TOPROL-XL) 50 MG 24 hr tablet    Sig: Take 1 tablet (50 mg total) by mouth daily. Take with or immediately following a meal.    Dispense:  90 tablet    Refill:  1  . nystatin cream (MYCOSTATIN)    Sig: Apply 1 application topically 2 (two) times daily.    Dispense:  30 g    Refill:  2  . fluconazole (DIFLUCAN) 150 MG tablet    Sig: Take  1 tablet (150 mg total) by mouth once. Repeat if needed    Dispense:  2 tablet    Refill:  0    Return in about 6 months (around 05/19/2015) for recheck hypertension.     Maddilynn Esperanza Paulita Fujita, M.D. [facility] [address] [place], [place]  [address] [phone] phone [phone] fax

## 2014-11-18 NOTE — Patient Instructions (Signed)
1. Call in two weeks if rash has not completely resolved.  Hypertension Hypertension, commonly called high blood pressure, is when the force of blood pumping through your arteries is too strong. Your arteries are the blood vessels that carry blood from your heart throughout your body. A blood pressure reading consists of a higher number over a lower number, such as 110/72. The higher number (systolic) is the pressure inside your arteries when your heart pumps. The lower number (diastolic) is the pressure inside your arteries when your heart relaxes. Ideally you want your blood pressure below 120/80. Hypertension forces your heart to work harder to pump blood. Your arteries may become narrow or stiff. Having hypertension puts you at risk for heart disease, stroke, and other problems.  RISK FACTORS Some risk factors for high blood pressure are controllable. Others are not.  Risk factors you cannot control include:   Race. You may be at higher risk if you are African American.  Age. Risk increases with age.  Gender. Men are at higher risk than women before age 45 years. After age 45, women are at higher risk than men. Risk factors you can control include:  Not getting enough exercise or physical activity.  Being overweight.  Getting too much fat, sugar, calories, or salt in your diet.  Drinking too much alcohol. SIGNS AND SYMPTOMS Hypertension does not usually cause signs or symptoms. Extremely high blood pressure (hypertensive crisis) may cause headache, anxiety, shortness of breath, and nosebleed. DIAGNOSIS  To check if you have hypertension, your health care provider will measure your blood pressure while you are seated, with your arm held at the level of your heart. It should be measured at least twice using the same arm. Certain conditions can cause a difference in blood pressure between your right and left arms. A blood pressure reading that is higher than normal on one occasion does not  mean that you need treatment. If one blood pressure reading is high, ask your health care provider about having it checked again. TREATMENT  Treating high blood pressure includes making lifestyle changes and possibly taking medicine. Living a healthy lifestyle can help lower high blood pressure. You may need to change some of your habits. Lifestyle changes may include:  Following the DASH diet. This diet is high in fruits, vegetables, and whole grains. It is low in salt, red meat, and added sugars.  Getting at least 2 hours of brisk physical activity every week.  Losing weight if necessary.  Not smoking.  Limiting alcoholic beverages.  Learning ways to reduce stress. If lifestyle changes are not enough to get your blood pressure under control, your health care provider may prescribe medicine. You may need to take more than one. Work closely with your health care provider to understand the risks and benefits. HOME CARE INSTRUCTIONS  Have your blood pressure rechecked as directed by your health care provider.   Take medicines only as directed by your health care provider. Follow the directions carefully. Blood pressure medicines must be taken as prescribed. The medicine does not work as well when you skip doses. Skipping doses also puts you at risk for problems.   Do not smoke.   Monitor your blood pressure at home as directed by your health care provider. SEEK MEDICAL CARE IF:   You think you are having a reaction to medicines taken.  You have recurrent headaches or feel dizzy.  You have swelling in your ankles.  You have trouble with your vision. SEEK  IMMEDIATE MEDICAL CARE IF:  You develop a severe headache or confusion.  You have unusual weakness, numbness, or feel faint.  You have severe chest or abdominal pain.  You vomit repeatedly.  You have trouble breathing. MAKE SURE YOU:   Understand these instructions.  Will watch your condition.  Will get help  right away if you are not doing well or get worse. Document Released: 09/25/2005 Document Revised: 02/09/2014 Document Reviewed: 07/18/2013 [facility] Patient Information 2015 [place], [place]. This information is not intended to replace advice given to you by your health care provider. Make sure you discuss any questions you have with your health care provider.

## 2014-11-19 LAB — COMPREHENSIVE METABOLIC PANEL
ALBUMIN: 4.4 g/dL (ref 3.5–5.2)
ALK PHOS: 76 U/L (ref 39–117)
ALT: 23 U/L (ref 0–35)
AST: 20 U/L (ref 0–37)
BUN: 16 mg/dL (ref 6–23)
CALCIUM: 8.8 mg/dL (ref 8.4–10.5)
CHLORIDE: 103 meq/L (ref 96–112)
CO2: 25 mEq/L (ref 19–32)
Creat: 0.75 mg/dL (ref 0.50–1.10)
GLUCOSE: 95 mg/dL (ref 70–99)
POTASSIUM: 3.9 meq/L (ref 3.5–5.3)
SODIUM: 139 meq/L (ref 135–145)
TOTAL PROTEIN: 6.6 g/dL (ref 6.0–8.3)
Total Bilirubin: 0.5 mg/dL (ref 0.2–1.2)

## 2014-11-19 LAB — TSH: TSH: 1.168 u[IU]/mL (ref 0.350–4.500)

## 2014-11-19 LAB — HEMOGLOBIN A1C
Hgb A1c MFr Bld: 5.2 % (ref <5.7)
Mean Plasma Glucose: 103 mg/dL (ref <117)

## 2014-12-10 ENCOUNTER — Telehealth: Payer: Self-pay

## 2014-12-10 NOTE — Telephone Encounter (Signed)
Patient left message at 1:16 Wednesday requesting a call regarding records her husband was trying to have sent to another MDs office.   [phone]  [phone]

## 2014-12-15 NOTE — Telephone Encounter (Signed)
Sheppard PlumberBarbara Briggs printed the information to be faxed on Monday, April 7.

## 2015-05-19 ENCOUNTER — Ambulatory Visit (INDEPENDENT_AMBULATORY_CARE_PROVIDER_SITE_OTHER): Payer: Managed Care, Other (non HMO) | Admitting: Family Medicine

## 2015-05-19 ENCOUNTER — Encounter: Payer: Self-pay | Admitting: Family Medicine

## 2015-05-19 VITALS — BP 106/78 | HR 85 | Temp 98.1°F | Resp 16 | Ht 64.25 in | Wt 233.4 lb

## 2015-05-19 DIAGNOSIS — J301 Allergic rhinitis due to pollen: Secondary | ICD-10-CM

## 2015-05-19 DIAGNOSIS — I10 Essential (primary) hypertension: Secondary | ICD-10-CM | POA: Diagnosis not present

## 2015-05-19 DIAGNOSIS — E669 Obesity, unspecified: Secondary | ICD-10-CM | POA: Diagnosis not present

## 2015-05-19 DIAGNOSIS — Z6839 Body mass index (BMI) 39.0-39.9, adult: Secondary | ICD-10-CM | POA: Diagnosis not present

## 2015-05-19 DIAGNOSIS — K219 Gastro-esophageal reflux disease without esophagitis: Secondary | ICD-10-CM

## 2015-05-19 NOTE — Progress Notes (Signed)
Subjective:    Patient ID: Hannah Buchanan, female    DOB: April 28, 1971, 44 y.o.   MRN: [id number]  05/19/2015  Follow-up   HPI This 44 y.o. female presents for six month follow-up:    2. Obesity: started a diet this week.  Having back and knee issues.  [employer] which is two shakes per day and healthy dinner; healthy snacks.  Has coach who is PA.  Does a cleanse twice per week.  The next program is for exercise.   Rides bike stationary bike 3-4 days per week.    3. Gluten intolerance: has not lost any weight.    4.  HTN: Patient reports good compliance with medication, good tolerance to medication, and good symptom control.  Does not check much at home.   Taking diuretic only as needed for swelling.  Takes twice per month.    5.  GERD: Patient reports good compliance with medication, good tolerance to medication, and good symptom control.  Taking Omeprazole 37m daily.    6. Health maintenance: Mammogram not scheduled yet.  Son is depressed and has attempted suicide twice.  [facility] gynecology; pap smear is due.   Review of Systems  Constitutional: Negative for fever, chills, diaphoresis and fatigue.  Eyes: Negative for visual disturbance.  Respiratory: Negative for cough and shortness of breath.   Cardiovascular: Negative for chest pain, palpitations and leg swelling.  Gastrointestinal: Negative for nausea, vomiting, abdominal pain, diarrhea and constipation.  Endocrine: Negative for cold intolerance, heat intolerance, polydipsia, polyphagia and polyuria.  Neurological: Negative for dizziness, tremors, seizures, syncope, facial asymmetry, speech difficulty, weakness, light-headedness, numbness and headaches.    Past Medical History  Diagnosis Date  . GERD (gastroesophageal reflux disease)   . Hypertension   . Status post dilation of esophageal narrowing   . Pneumonia   . Recurrent cold sores   . Allergy     Claritin daily yearround.   Past Surgical History  Procedure  Laterality Date  . Bladder repair  1998    David Lowe/Gynecology.  . Breast surgery  1999    breast reduction  . Admission  10/09/1998    prolonged syncopal event; admission for 24 hours.  [place]   No Known Allergies Current Outpatient Prescriptions  Medication Sig Dispense Refill  . albuterol (PROVENTIL HFA;VENTOLIN HFA) 108 (90 BASE) MCG/ACT inhaler Inhale 2 puffs into the lungs every 6 (six) hours as needed for wheezing. 1 Inhaler 0  . chlorthalidone (HYGROTON) 25 MG tablet Take 25 mg by mouth as needed.    .Marland KitchenHYDROcodone-acetaminophen (NORCO/VICODIN) 5-325 MG per tablet Take 1 tablet by mouth every 6 (six) hours as needed for moderate pain. 15 tablet 0  . loratadine (CLARITIN) 10 MG tablet Take 10 mg by mouth daily.    . meloxicam (MOBIC) 7.5 MG tablet Take 7.5 mg by mouth daily.    . metoprolol succinate (TOPROL-XL) 50 MG 24 hr tablet Take 1 tablet (50 mg total) by mouth daily. Take with or immediately following a meal. 90 tablet 1  . nitroGLYCERIN (NITROSTAT) 0.4 MG SL tablet Place 0.4 mg under the tongue every 5 (five) minutes as needed for chest pain.    .Marland Kitchennystatin cream (MYCOSTATIN) Apply 1 application topically 2 (two) times daily. 30 g 2  . omeprazole (PRILOSEC) 20 MG capsule Take 20 mg by mouth daily.    . valACYclovir (VALTREX) 500 MG tablet Take 500 mg by mouth as needed.     No current facility-administered medications for  this visit.   Social History   Social History  . Marital Status: Married    Spouse Name: N/A  . Number of Children: 2  . Years of Education: N/A   Occupational History  . medical assistant    Social History Main Topics  . Smoking status: Never Smoker   . Smokeless tobacco: Never Used  . Alcohol Use: Yes     Comment: occasional  . Drug Use: No  . Sexual Activity: Yes   Other Topics Concern  . Not on file   Social History Narrative   Marital status: married x 24 years; happily      Chldren: 2 children (55, 64); no grandchldren.       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   Family History  Problem Relation Age of Onset  . Uterine cancer Maternal Grandmother   . Cancer Maternal Grandmother   . Thyroid cancer Maternal Aunt   . Cancer Maternal Uncle     unknown origin-Mets  . Colon cancer Father   . Colon polyps Father   . Diabetes Father   . Cancer Father 68    colon cancer  . Arthritis Mother   . Asthma Mother   . Depression Sister   . Asthma Sister   . Migraines Sister   . Alcohol abuse Brother   . Depression Brother        Objective:    BP 106/78 mmHg  Pulse 85  Temp(Src) 98.1 F (36.7 C) (Oral)  Resp 16  Ht 5' 4.25" (1.632 m)  Wt 233 lb 6.4 oz (105.87 kg)  BMI 39.75 kg/m2  LMP 04/27/2015 Physical Exam  Constitutional: She is oriented to person, place, and time. She appears well-developed and well-nourished. No distress.  obese  HENT:  Head: Normocephalic and atraumatic.  Right Ear: External ear normal.  Left Ear: External ear normal.  Nose: Nose normal.  Mouth/Throat: Oropharynx is clear and moist.  Eyes: Conjunctivae and EOM are normal. Pupils are equal, round, and reactive to light.  Neck: Normal range of motion. Neck supple. Carotid bruit is not present. No thyromegaly present.  Cardiovascular: Normal rate, regular rhythm, normal heart sounds and intact distal pulses.  Exam reveals no gallop and no friction rub.   No murmur heard. Pulmonary/Chest: Effort normal and breath sounds normal. She has no wheezes. She has no rales.  Abdominal: Soft. Bowel sounds are normal. She exhibits no distension and no mass. There is no tenderness. There is no rebound and no guarding.  Lymphadenopathy:    She has no cervical adenopathy.  Neurological: She is alert and oriented to person, place, and time. No cranial nerve deficit.  Skin: Skin  is warm and dry. No rash noted. She is not diaphoretic. No erythema. No pallor.  Psychiatric: She has a normal mood and affect. Her behavior is normal.   Results for orders placed or performed in visit on 11/18/14  CBC with Differential/Platelet  Result Value Ref Range   WBC 6.4 4.0 - 10.5 K/uL   RBC 4.14 3.87 - 5.11 MIL/uL   Hemoglobin 12.4 12.0 - 15.0 g/dL   HCT 37.1 36.0 - 46.0 %   MCV 89.6 78.0 - 100.0 fL   MCH  30.0 26.0 - 34.0 pg   MCHC 33.4 30.0 - 36.0 g/dL   RDW 14.0 11.5 - 15.5 %   Platelets 220 150 - 400 K/uL   MPV 11.6 8.6 - 12.4 fL   Neutrophils Relative % 59 43 - 77 %   Neutro Abs 3.8 1.7 - 7.7 K/uL   Lymphocytes Relative 32 12 - 46 %   Lymphs Abs 2.0 0.7 - 4.0 K/uL   Monocytes Relative 6 3 - 12 %   Monocytes Absolute 0.4 0.1 - 1.0 K/uL   Eosinophils Relative 3 0 - 5 %   Eosinophils Absolute 0.2 0.0 - 0.7 K/uL   Basophils Relative 0 0 - 1 %   Basophils Absolute 0.0 0.0 - 0.1 K/uL   Smear Review Criteria for review not met   Comprehensive metabolic panel  Result Value Ref Range   Sodium 139 135 - 145 mEq/L   Potassium 3.9 3.5 - 5.3 mEq/L   Chloride 103 96 - 112 mEq/L   CO2 25 19 - 32 mEq/L   Glucose, Bld 95 70 - 99 mg/dL   BUN 16 6 - 23 mg/dL   Creat 0.75 0.50 - 1.10 mg/dL   Total Bilirubin 0.5 0.2 - 1.2 mg/dL   Alkaline Phosphatase 76 39 - 117 U/L   AST 20 0 - 37 U/L   ALT 23 0 - 35 U/L   Total Protein 6.6 6.0 - 8.3 g/dL   Albumin 4.4 3.5 - 5.2 g/dL   Calcium 8.8 8.4 - 10.5 mg/dL  Hemoglobin A1c  Result Value Ref Range   Hgb A1c MFr Bld 5.2 <5.7 %   Mean Plasma Glucose 103 <117 mg/dL  TSH  Result Value Ref Range   TSH 1.168 0.350 - 4.500 uIU/mL  POCT Skin KOH  Result Value Ref Range   Skin KOH, POC Negative   POCT urinalysis dipstick  Result Value Ref Range   Color, UA yellow    Clarity, UA clear    Glucose, UA neg    Bilirubin, UA neg    Ketones, UA neg    Spec Grav, UA 1.015    Blood, UA neg    pH, UA 6.5    Protein, UA neg     Urobilinogen, UA 0.2    Nitrite, UA neg    Leukocytes, UA Negative        Assessment & Plan:   1. Essential hypertension, benign   2. Gastroesophageal reflux disease without esophagitis   3. Allergic rhinitis due to pollen   4. Obesity   5. BMI 39.0-39.9,adult    1. HTN: controlled; continue current medications; as starts to lose weight, recommend monitoring BP closely due to low normal reading today.   2.  GERD: controlled; continue current medications. 3.  Allergic Rhinitis: controlled; continue current medications. 4.  Obesity/BMI 39: highly encourage exercise, weight loss, and low-caloric food choices.   No orders of the defined types were placed in this encounter.    No orders of the defined types were placed in this encounter.    Return in about 6 months (around 11/19/2015) for recheck high blood pressure.   Kristi Elayne Guerin, M.D. [facility] [address] [place], [place]  [address] [phone] phone [phone] fax

## 2015-05-19 NOTE — Patient Instructions (Signed)

## 2015-07-26 DIAGNOSIS — Z0271 Encounter for disability determination: Secondary | ICD-10-CM

## 2015-08-04 ENCOUNTER — Other Ambulatory Visit: Payer: Self-pay | Admitting: Family Medicine

## 2015-11-03 ENCOUNTER — Telehealth: Payer: Self-pay

## 2015-11-03 NOTE — Telephone Encounter (Signed)
Discussed with Dr. Katrinka Blazing at last OV to 25 mg metroprol (reducing the tablets by 50%)   Changed appointment till late March   [facility] [address]   [phone]

## 2015-11-03 NOTE — Telephone Encounter (Signed)
1. Essential hypertension, benign   2. Gastroesophageal reflux disease without esophagitis   3. Allergic rhinitis due to pollen   4. Obesity   5. BMI 39.0-39.9,adult    1. HTN: controlled; continue current medications; as starts to lose weight, recommend monitoring BP closely due to low normal reading today.        Spoke with pt, she has lost a lot of weight. She lost 35 lbs. She would like to go down to 25 mg and she has been feeling dizzy and tired. She feels her blood pressure is too low. She has been running around 118/74. Please advise.

## 2015-11-05 MED ORDER — METOPROLOL TARTRATE 25 MG PO TABS: 25.0000 mg | ORAL_TABLET | Freq: Every day | ORAL | Status: DC

## 2015-11-05 NOTE — Telephone Encounter (Signed)
Yes; fine to decrease Metoprolol to  daily.

## 2015-11-05 NOTE — Telephone Encounter (Signed)
Pt advised.

## 2015-11-08 ENCOUNTER — Ambulatory Visit: Payer: Managed Care, Other (non HMO) | Admitting: Family Medicine

## 2015-11-09 ENCOUNTER — Telehealth: Payer: Self-pay

## 2015-11-09 MED ORDER — METOPROLOL SUCCINATE ER 25 MG PO TB24: 25.0000 mg | ORAL_TABLET | Freq: Every day | ORAL | Status: DC

## 2015-11-09 NOTE — Telephone Encounter (Signed)
Pharm faxed req for clarification on whether metoprolol Rx should be for succ or tart 25 mg. Pt has been on succinate 50 mg, and Dr Katrinka Blazing approved pt's req to decrease by half to 25 mg, but looks like she did not intend to switch pt from ER to IR. I will resend new Rx for the 24 hr tablets.

## 2015-11-26 ENCOUNTER — Ambulatory Visit: Payer: Managed Care, Other (non HMO) | Admitting: Family Medicine

## 2015-12-28 ENCOUNTER — Ambulatory Visit (INDEPENDENT_AMBULATORY_CARE_PROVIDER_SITE_OTHER): Payer: Managed Care, Other (non HMO) | Admitting: Family Medicine

## 2015-12-28 ENCOUNTER — Encounter: Payer: Self-pay | Admitting: Family Medicine

## 2015-12-28 VITALS — BP 111/74 | HR 73 | Temp 98.4°F | Resp 16 | Ht 64.0 in | Wt 207.4 lb

## 2015-12-28 DIAGNOSIS — I10 Essential (primary) hypertension: Secondary | ICD-10-CM

## 2015-12-28 DIAGNOSIS — E669 Obesity, unspecified: Secondary | ICD-10-CM

## 2015-12-28 DIAGNOSIS — J301 Allergic rhinitis due to pollen: Secondary | ICD-10-CM

## 2015-12-28 DIAGNOSIS — Z114 Encounter for screening for human immunodeficiency virus [HIV]: Secondary | ICD-10-CM | POA: Diagnosis not present

## 2015-12-28 DIAGNOSIS — R55 Syncope and collapse: Secondary | ICD-10-CM

## 2015-12-28 DIAGNOSIS — F430 Acute stress reaction: Secondary | ICD-10-CM

## 2015-12-28 DIAGNOSIS — M1711 Unilateral primary osteoarthritis, right knee: Secondary | ICD-10-CM | POA: Diagnosis not present

## 2015-12-28 DIAGNOSIS — K219 Gastro-esophageal reflux disease without esophagitis: Secondary | ICD-10-CM | POA: Diagnosis not present

## 2015-12-28 DIAGNOSIS — R9431 Abnormal electrocardiogram [ECG] [EKG]: Secondary | ICD-10-CM

## 2015-12-28 LAB — POC MICROSCOPIC URINALYSIS (UMFC): Mucus: ABSENT

## 2015-12-28 LAB — CBC WITH DIFFERENTIAL/PLATELET
BASOS ABS: 0 10*3/uL (ref 0.0–0.1)
Basophils Relative: 0 % (ref 0–1)
EOS PCT: 5 % (ref 0–5)
Eosinophils Absolute: 0.3 10*3/uL (ref 0.0–0.7)
HEMATOCRIT: 40.2 % (ref 36.0–46.0)
HEMOGLOBIN: 13.8 g/dL (ref 12.0–15.0)
LYMPHS ABS: 2.3 10*3/uL (ref 0.7–4.0)
LYMPHS PCT: 34 % (ref 12–46)
MCH: 30.4 pg (ref 26.0–34.0)
MCHC: 34.3 g/dL (ref 30.0–36.0)
MCV: 88.5 fL (ref 78.0–100.0)
MONO ABS: 0.3 10*3/uL (ref 0.1–1.0)
MPV: 12.2 fL (ref 8.6–12.4)
Monocytes Relative: 5 % (ref 3–12)
NEUTROS ABS: 3.8 10*3/uL (ref 1.7–7.7)
Neutrophils Relative %: 56 % (ref 43–77)
Platelets: 200 10*3/uL (ref 150–400)
RBC: 4.54 MIL/uL (ref 3.87–5.11)
RDW: 13.4 % (ref 11.5–15.5)
WBC: 6.7 10*3/uL (ref 4.0–10.5)

## 2015-12-28 LAB — POCT URINALYSIS DIP (MANUAL ENTRY)
Bilirubin, UA: NEGATIVE
Glucose, UA: NEGATIVE
Ketones, POC UA: NEGATIVE
LEUKOCYTES UA: NEGATIVE
NITRITE UA: NEGATIVE
PH UA: 7
PROTEIN UA: NEGATIVE
RBC UA: NEGATIVE
Spec Grav, UA: 1.015
Urobilinogen, UA: 0.2

## 2015-12-28 LAB — TSH: TSH: 1.78 m[IU]/L

## 2015-12-28 MED ORDER — METOPROLOL SUCCINATE ER 25 MG PO TB24: 37.5000 mg | ORAL_TABLET | Freq: Every day | ORAL | Status: DC

## 2015-12-28 NOTE — Progress Notes (Signed)
Subjective:    Patient ID: Hannah Buchanan, female    DOB: January 24, 1971, 45 y.o.   MRN: [id number]  12/28/2015  Hypertension   HPI This 45 y.o. female presents for six month follow-up:  1. HTN: Patient reports good compliance with medication, good tolerance to medication, and good symptom control.  Decreased 50mg  to 25mg  daily.  Palpitations have worsened with decreased dose; can hear in ears.  BP running 120-130/75.  When on 50mg ,BP too low.  With exercise, heart really pounds.  With exertion, heart really pounds.  Full Holter monitor, echo in 2015.  When transitioning between 50mg  to 25mg  daily.  When transitioned from 25mg  to 50mg  ran out of medication for two days.  Stretched really hard upon awakening and passed out.  Occurred one month ago.  Had a concussion for two weeks.  Hit hard with fall.  No headaches, blurred vision.  Confused, noise intolerant.  Knees gave out.  Hit knees on the wall and head.  No EKG by Penni BombardKendall.  No blood work.    2.  GERD; Patient reports good compliance with medication, good tolerance to medication, and good symptom control.    3.  Allergic rhinitis: Patient reports good compliance with medication, good tolerance to medication, and good symptom control.  Claritin 10mg  daily.  Lower extremity swelling: stopped taking diuretic.  R leg is bigger than left.    GERD:  Omeprazole 20mg  daily; must take Omeprazole per day.  Had been suffering with regurgitation at night.    4. Obesity: shake diet; drinking two shakes per day; two snacks per day; one meal.  Has lost 35 pounds.  Feels better.  Cut out gluten which has helped joint pain tremendously.    5.  Stress reaction: SON HAS HAD FOUR SUICIDE ATTEMPTS IN SIX MONTHS.  6.  Knees B:  Taking Meloxicam; sees [facility]; recommend TKR on R.  Diet, weight loss, Meloxicam.  Review of Systems  Constitutional: Negative for fever, chills, diaphoresis and fatigue.  Eyes: Negative for visual disturbance.  Respiratory:  Negative for cough and shortness of breath.   Cardiovascular: Negative for chest pain, palpitations and leg swelling.  Gastrointestinal: Negative for nausea, vomiting, abdominal pain, diarrhea and constipation.  Endocrine: Negative for cold intolerance, heat intolerance, polydipsia, polyphagia and polyuria.  Neurological: Negative for dizziness, tremors, seizures, syncope, facial asymmetry, speech difficulty, weakness, light-headedness, numbness and headaches.    Past Medical History  Diagnosis Date  . GERD (gastroesophageal reflux disease)   . Hypertension   . Status post dilation of esophageal narrowing   . Pneumonia   . Recurrent cold sores   . Allergy     Claritin daily yearround.   Past Surgical History  Procedure Laterality Date  . Bladder repair  1998    [facility]/Gynecology.  . Breast surgery  1999    breast reduction  . Admission  10/09/1998    prolonged syncopal event; admission for 24 hours.  [place]   No Known Allergies  Social History   Social History  . Marital Status: Married    Spouse Name: N/A  . Number of Children: 2  . Years of Education: N/A   Occupational History  . medical assistant    Social History Main Topics  . Smoking status: Never Smoker   . Smokeless tobacco: Never Used  . Alcohol Use: Yes     Comment: occasional  . Drug Use: No  . Sexual Activity: Yes   Other Topics Concern  . Not  on file   Social History Narrative   Marital status: married x 24 years; happily      Chldren: 2 children (20, 58); no grandchldren.      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   Family History  Problem Relation Age of Onset  . Uterine cancer Maternal Grandmother   . Cancer Maternal Grandmother   . Thyroid cancer Maternal Aunt   . Cancer Maternal Uncle     unknown  origin-[place]  . Colon cancer Father   . Colon polyps Father   . Diabetes Father   . Cancer Father 57    colon cancer  . Arthritis Father     TKR  . Arthritis Mother   . Asthma Mother   . Depression Sister   . Asthma Sister   . Migraines Sister   . Alcohol abuse Brother   . Depression Brother        Objective:    BP 111/74 mmHg  Pulse 73  Temp(Src) 98.4 F (36.9 C) (Oral)  Resp 16  Ht  (1.626 m)  Wt 207 lb 6.4 oz (94.076 kg)  BMI 35.58 kg/m2  LMP 12/06/2015 Physical Exam  Constitutional: She is oriented to person, place, and time. She appears well-developed and well-nourished. No distress.  HENT:  Head: Normocephalic and atraumatic.  Right Ear: External ear normal.  Left Ear: External ear normal.  Nose: Nose normal.  Mouth/Throat: Oropharynx is clear and moist.  Eyes: Conjunctivae and EOM are normal. Pupils are equal, round, and reactive to light.  Neck: Normal range of motion. Neck supple. Carotid bruit is not present. No thyromegaly present.  Cardiovascular: Normal rate, regular rhythm, normal heart sounds and intact distal pulses.  Exam reveals no gallop and no friction rub.   No murmur heard. Pulmonary/Chest: Effort normal and breath sounds normal. She has no wheezes. She has no rales.  Abdominal: Soft. Bowel sounds are normal. She exhibits no distension and no mass. There is no tenderness. There is no rebound and no guarding.  Lymphadenopathy:    She has no cervical adenopathy.  Neurological: She is alert and oriented to person, place, and time. No cranial nerve deficit.  Skin: Skin is warm and dry. No rash noted. She is not diaphoretic. No erythema. No pallor.  Psychiatric: She has a normal mood and affect. Her behavior is normal.        Assessment & Plan:   1. Syncope and collapse   2. Essential hypertension, benign   3. Screening for HIV (human immunodeficiency virus)   4. Gastroesophageal reflux disease without esophagitis   5. Seasonal allergic  rhinitis due to pollen   6. Obesity   7. Primary osteoarthritis of right knee   8. Stress reaction     Orders Placed This Encounter  Procedures  . CBC with Differential/Platelet  . Comprehensive metabolic panel  . TSH  . HIV antibody  . POCT urinalysis dipstick  . POCT Microscopic Urinalysis ([facility])  . EKG 12-Lead   Meds ordered this encounter  Medications  . metoprolol succinate (TOPROL-XL) 25 MG 24 hr tablet    Sig: Take 1.5 tablets (37.5 mg total) by mouth daily.    Dispense:  135  tablet    Refill:  3    Return in about 6 months (around 06/29/2016) for recheck.    Elainna Eshleman Paulita Fujita, M.D. [facility] [address] [place], [place]  [address] [phone] phone [phone] fax

## 2015-12-28 NOTE — Patient Instructions (Signed)
     IF you received an x-ray today, you will receive an invoice from [facility]. Please contact [facility] at [phone] with questions or concerns regarding your invoice.   IF you received labwork today, you will receive an invoice from [facility]. Please contact [facility] at [phone] with questions or concerns regarding your invoice.   Our billing staff will not be able to assist you with questions regarding bills from these companies.  You will be contacted with the lab results as soon as they are available. The fastest way to get your results is to activate your My Chart account. Instructions are located on the last page of this paperwork. If you have not heard from us regarding the results in 2 weeks, please contact this office.      

## 2015-12-29 LAB — COMPREHENSIVE METABOLIC PANEL
ALBUMIN: 4.6 g/dL (ref 3.6–5.1)
ALK PHOS: 75 U/L (ref 33–115)
ALT: 17 U/L (ref 6–29)
AST: 17 U/L (ref 10–35)
BILIRUBIN TOTAL: 0.7 mg/dL (ref 0.2–1.2)
BUN: 23 mg/dL (ref 7–25)
CALCIUM: 9.7 mg/dL (ref 8.6–10.2)
CO2: 25 mmol/L (ref 20–31)
Chloride: 97 mmol/L — ABNORMAL LOW (ref 98–110)
Creat: 0.84 mg/dL (ref 0.50–1.10)
GLUCOSE: 90 mg/dL (ref 65–99)
POTASSIUM: 4.3 mmol/L (ref 3.5–5.3)
Sodium: 139 mmol/L (ref 135–146)
TOTAL PROTEIN: 7.2 g/dL (ref 6.1–8.1)

## 2015-12-29 LAB — HIV ANTIBODY (ROUTINE TESTING W REFLEX): HIV: NONREACTIVE

## 2016-01-18 NOTE — Addendum Note (Signed)
Addended by: Ethelda ChickSMITH, Irbin Fines M on: 01/18/2016 11:19 PM   Modules accepted: Orders

## 2016-05-08 ENCOUNTER — Ambulatory Visit (INDEPENDENT_AMBULATORY_CARE_PROVIDER_SITE_OTHER): Payer: Managed Care, Other (non HMO) | Admitting: Gastroenterology

## 2016-05-08 ENCOUNTER — Encounter: Payer: Self-pay | Admitting: Gastroenterology

## 2016-05-08 VITALS — BP 100/66 | HR 68 | Ht 64.0 in | Wt 212.5 lb

## 2016-05-08 DIAGNOSIS — R1314 Dysphagia, pharyngoesophageal phase: Secondary | ICD-10-CM

## 2016-05-08 DIAGNOSIS — K219 Gastro-esophageal reflux disease without esophagitis: Secondary | ICD-10-CM

## 2016-05-08 MED ORDER — OMEPRAZOLE 40 MG PO CPDR: 40.0000 mg | DELAYED_RELEASE_CAPSULE | Freq: Every day | ORAL | 11 refills | Status: DC

## 2016-05-08 NOTE — Progress Notes (Signed)
    History of Present Illness: This is a 45 year old female referred by Dr. Yates Decamp for the evaluation of dysphagia and chest pain. She has a history of GERD and peptic stricture which was dilated at EGD in 2011. Over the past several months she has had more frequent episodes of solid food dysphagia primarily to chicken and bread. She has chest pain associated with dysphagia. She denies other episodes of chest pain and any active reflux symptoms. She takes omeprazole 20 mg daily. She states she has right upper quadrant pain that is always associated with drinking hot beverages. She began a gluten-free diet and states it is helping with her joint symptoms.   Review of Systems: Pertinent positive and negative review of systems were noted in the above HPI section. All other review of systems were otherwise negative.  Current Medications, Allergies, Past Medical History, Past Surgical History, Family History and Social History were reviewed in [employer] record.  Physical Exam: General: Well developed, well nourished, no acute distress Head: Normocephalic and atraumatic Eyes:  sclerae anicteric, EOMI Ears: Normal auditory acuity Mouth: No deformity or lesions Neck: Supple, no masses or thyromegaly Lungs: Clear throughout to auscultation Heart: Regular rate and rhythm; no murmurs, rubs or bruits Abdomen: Soft, non tender and non distended. No masses, hepatosplenomegaly or hernias noted. Normal Bowel sounds Musculoskeletal: Symmetrical with no gross deformities  Skin: No lesions on visible extremities Pulses:  Normal pulses noted Extremities: No clubbing, cyanosis, edema or deformities noted Neurological: Alert oriented x 4, grossly nonfocal Cervical Nodes:  No significant cervical adenopathy Inguinal Nodes: No significant inguinal adenopathy Psychological:  Alert and cooperative. Normal mood and affect  Assessment and Recommendations:  1. Dysphagia to solids.  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  Follow all standard antireflux measures. Increase omeprazole to 40 mg daily. Schedule EGD with dilation. The risks (including bleeding, perforation, infection, missed lesions, medication reactions and possible hospitalization or surgery if complications occur), benefits, and alternatives to endoscopy with possible biopsy and possible dilation were discussed with the patient and they consent to proceed.     cc: Dr. Yates Decamp

## 2016-05-08 NOTE — Patient Instructions (Signed)
We have sent the following medications to your pharmacy for you to pick up at your convenience: omeprazole 40 mg daily.   You have been scheduled for an endoscopy. Please follow written instructions given to you at your visit today. If you use inhalers (even only as needed), please bring them with you on the day of your procedure. Your physician has requested that you go to www.startemmi.com and enter the access code given to you at your visit today. This web site gives a general overview about your procedure. However, you should still follow specific instructions given to you by our office regarding your preparation for the procedure.  Normal BMI (Body Mass Index- based on height and weight) is between 19 and 25. Your BMI today is Body mass index is 36.48 kg/m. Marland Kitchen Please consider follow up  regarding your BMI with your Primary Care Provider.  Thank you for choosing me and [facility].  Venita Lick. Pleas Koch., MD., Clementeen Graham

## 2016-06-13 ENCOUNTER — Encounter: Payer: Self-pay | Admitting: Gastroenterology

## 2016-06-15 ENCOUNTER — Other Ambulatory Visit (HOSPITAL_COMMUNITY): Payer: Self-pay | Admitting: Orthopedic Surgery

## 2016-06-15 DIAGNOSIS — M7989 Other specified soft tissue disorders: Secondary | ICD-10-CM

## 2016-06-16 ENCOUNTER — Ambulatory Visit (HOSPITAL_COMMUNITY)
Admission: RE | Admit: 2016-06-16 | Discharge: 2016-06-16 | Disposition: A | Discharge status: Home / Self Care | Payer: Managed Care, Other (non HMO) | Source: Ambulatory Visit | Attending: Cardiology | Admitting: Cardiology

## 2016-06-16 ENCOUNTER — Other Ambulatory Visit (HOSPITAL_COMMUNITY): Payer: Self-pay | Admitting: Sports Medicine

## 2016-06-16 DIAGNOSIS — M79604 Pain in right leg: Secondary | ICD-10-CM | POA: Diagnosis not present

## 2016-06-16 DIAGNOSIS — I10 Essential (primary) hypertension: Secondary | ICD-10-CM | POA: Diagnosis not present

## 2016-06-16 DIAGNOSIS — M7989 Other specified soft tissue disorders: Secondary | ICD-10-CM

## 2016-06-19 ENCOUNTER — Encounter: Payer: Managed Care, Other (non HMO) | Admitting: Gastroenterology

## 2016-06-27 ENCOUNTER — Encounter: Payer: Managed Care, Other (non HMO) | Admitting: Gastroenterology

## 2016-10-09 HISTORY — PX: UPPER GASTROINTESTINAL ENDOSCOPY: SHX188

## 2017-01-08 ENCOUNTER — Encounter: Payer: Self-pay | Admitting: Gastroenterology

## 2017-01-08 ENCOUNTER — Ambulatory Visit (INDEPENDENT_AMBULATORY_CARE_PROVIDER_SITE_OTHER): Payer: Managed Care, Other (non HMO) | Admitting: Gastroenterology

## 2017-01-08 VITALS — BP 110/80 | HR 78 | Resp 16 | Ht 64.0 in | Wt 222.0 lb

## 2017-01-08 DIAGNOSIS — R1310 Dysphagia, unspecified: Secondary | ICD-10-CM | POA: Diagnosis not present

## 2017-01-08 DIAGNOSIS — R1319 Other dysphagia: Secondary | ICD-10-CM

## 2017-01-08 DIAGNOSIS — K219 Gastro-esophageal reflux disease without esophagitis: Secondary | ICD-10-CM | POA: Diagnosis not present

## 2017-01-08 MED ORDER — RANITIDINE HCL 300 MG PO CAPS: 300.0000 mg | ORAL_CAPSULE | Freq: Two times a day (BID) | ORAL | 11 refills | Status: DC

## 2017-01-08 NOTE — Progress Notes (Signed)
    History of Present Illness: This is a 46 year old female complaining of difficulty swallowing and reflux symptoms. She was seen last July for the same complaints and her symptoms resolved after increasing omeprazole to 40 mg daily. She feels the higher dose of omeprazole negatively impacted her memory so she discontinued all acid reducing medications. She has frequent heartburn and has had ongoing solid food dysphagia particularly to breads and meat for the past several months. She has a history of an esophageal stricture and small hiatal hernia and underwent EGD with dilation in February 2011.  Current Medications, Allergies, Past Medical History, Past Surgical History, Family History and Social History were reviewed in [employer] record.  Physical Exam: General: Well developed, well nourished, no acute distress Head: Normocephalic and atraumatic Eyes:  sclerae anicteric, EOMI Ears: Normal auditory acuity Mouth: No deformity or lesions Lungs: Clear throughout to auscultation Heart: Regular rate and rhythm; no murmurs, rubs or bruits Abdomen: Soft, non tender and non distended. No masses, hepatosplenomegaly or hernias noted. Normal Bowel sounds Musculoskeletal: Symmetrical with no gross deformities  Pulses:  Normal pulses noted Extremities: No clubbing, cyanosis, edema or deformities noted Neurological: Alert oriented x 4, grossly nonfocal Psychological:  Alert and cooperative. Normal mood and affect  Assessment and Recommendations:  1. GERD and solid food dysphagia. Suspected recurrent stricture. Rule out esophagitis. The patient is reluctant to resume any PPI medication. All standard antireflux measures. Begin ranitidine 300 mg twice daily. Schedule EGD. The risks (including bleeding, perforation, infection, missed lesions, medication reactions and possible hospitalization or surgery if complications occur), benefits, and alternatives to endoscopy with possible  biopsy and possible dilation were discussed with the patient and they consent to proceed.

## 2017-01-08 NOTE — Patient Instructions (Signed)
We have sent the following medications to your pharmacy for you to pick up at your convenience:ranitidine 300 mg to take one tablet by mouth twice daily.    You have been scheduled for an endoscopy. Please follow written instructions given to you at your visit today. If you use inhalers (even only as needed), please bring them with you on the day of your procedure. Your physician has requested that you go to www.startemmi.com and enter the access code given to you at your visit today. This web site gives a general overview about your procedure. However, you should still follow specific instructions given to you by our office regarding your preparation for the procedure.  Normal BMI (Body Mass Index- based on height and weight) is between 19 and 25. Your BMI today is Body mass index is 38.11 kg/m. Marland Kitchen Please consider follow up  regarding your BMI with your Primary Care Provider.  Thank you for choosing me and [facility].  Venita Lick. Pleas Koch., MD., Clementeen Graham

## 2017-01-26 ENCOUNTER — Other Ambulatory Visit: Payer: Self-pay | Admitting: Family Medicine

## 2017-02-01 ENCOUNTER — Encounter: Payer: Self-pay | Admitting: Gastroenterology

## 2017-02-09 ENCOUNTER — Encounter: Payer: Managed Care, Other (non HMO) | Admitting: Gastroenterology

## 2017-02-13 ENCOUNTER — Encounter: Payer: Self-pay | Admitting: Gastroenterology

## 2017-02-13 ENCOUNTER — Ambulatory Visit (AMBULATORY_SURGERY_CENTER): Payer: Managed Care, Other (non HMO) | Admitting: Gastroenterology

## 2017-02-13 VITALS — BP 106/67 | HR 59 | Temp 99.3°F | Resp 12 | Ht 64.0 in | Wt 222.0 lb

## 2017-02-13 DIAGNOSIS — K222 Esophageal obstruction: Secondary | ICD-10-CM

## 2017-02-13 DIAGNOSIS — K219 Gastro-esophageal reflux disease without esophagitis: Secondary | ICD-10-CM

## 2017-02-13 DIAGNOSIS — R1310 Dysphagia, unspecified: Secondary | ICD-10-CM

## 2017-02-13 MED ORDER — SODIUM CHLORIDE 0.9 % IV SOLN: 500.0000 mL | INTRAVENOUS | Status: DC

## 2017-02-13 NOTE — Patient Instructions (Signed)
YOU HAD AN ENDOSCOPIC PROCEDURE TODAY AT THE [facility]:   Refer to the procedure report that was given to you for any specific questions about what was found during the examination.  If the procedure report does not answer your questions, please call your gastroenterologist to clarify.  If you requested that your care partner not be given the details of your procedure findings, then the procedure report has been included in a sealed envelope for you to review at your convenience later.  YOU SHOULD EXPECT: Some feelings of bloating in the abdomen. Passage of more gas than usual.  Walking can help get rid of the air that was put into your GI tract during the procedure and reduce the bloating. If you had a lower endoscopy (such as a colonoscopy or flexible sigmoidoscopy) you may notice spotting of blood in your stool or on the toilet paper. If you underwent a bowel prep for your procedure, you may not have a normal bowel movement for a few days.  Please Note:  You might notice some irritation and congestion in your nose or some drainage.  This is from the oxygen used during your procedure.  There is no need for concern and it should clear up in a day or so.  SYMPTOMS TO REPORT IMMEDIATELY:    Following upper endoscopy (EGD)  Vomiting of blood or coffee ground material  New chest pain or pain under the shoulder blades  Painful or persistently difficult swallowing  New shortness of breath  Fever of 100F or higher  Black, tarry-looking stools  For urgent or emergent issues, a gastroenterologist can be reached at any hour by calling [phone].   DIET:  We do recommend a small meal at first, but then you may proceed to your regular diet.  Drink plenty of fluids but you should avoid alcoholic beverages for 24 hours.  ACTIVITY:  You should plan to take it easy for the rest of today and you should NOT DRIVE or use heavy machinery until tomorrow (because of the sedation medicines used  during the test).    FOLLOW UP: Our staff will call the number listed on your records the next business day following your procedure to check on you and address any questions or concerns that you may have regarding the information given to you following your procedure. If we do not reach you, we will leave a message.  However, if you are feeling well and you are not experiencing any problems, there is no need to return our call.  We will assume that you have returned to your regular daily activities without incident.  If any biopsies were taken you will be contacted by phone or by letter within the next 1-3 weeks.  Please call us at [phone] if you have not heard about the biopsies in 3 weeks.    SIGNATURES/CONFIDENTIALITY: You and/or your care partner have signed paperwork which will be entered into your electronic medical record.  These signatures attest to the fact that that the information above on your After Visit Summary has been reviewed and is understood.  Full responsibility of the confidentiality of this discharge information lies with you and/or your care-partner.  Anti reflux information.  To follow long term.  Esophagitis and stenosis information given.  Clear liquids for 2 hours. Then soft rest of today.  Information given.  Return to Dr. Russella DarStark in office 6 weeks.

## 2017-02-13 NOTE — Progress Notes (Signed)
Report to PACU, RN, vss, BBS= Clear.  

## 2017-02-13 NOTE — Op Note (Signed)
[facility] Patient Name: Hannah Buchanan Procedure Date: 02/13/2017 2:33 PM MRN: [id number] Endoscopist: Meryl DareMalcolm T Kena Limon , MD Age: 46 Referring MD:  Date of Birth: 01-15-1971 Gender: Female Account #: [id number] Procedure:                Upper GI endoscopy Indications:              Dysphagia, Gastro-esophageal reflux disease Medicines:                Monitored Anesthesia Care Procedure:                Pre-Anesthesia Assessment:                           - Prior to the procedure, a History and Physical                            was performed, and patient medications and                            allergies were reviewed. The patient's tolerance of                            previous anesthesia was also reviewed. The risks                            and benefits of the procedure and the sedation                            options and risks were discussed with the patient.                            All questions were answered, and informed consent                            was obtained. Prior Anticoagulants: The patient has                            taken no previous anticoagulant or antiplatelet                            agents. ASA Grade Assessment: II - A patient with                            mild systemic disease. After reviewing the risks                            and benefits, the patient was deemed in                            satisfactory condition to undergo the procedure.                           After obtaining informed consent, the endoscope was  passed under direct vision. Throughout the                            procedure, the patient's blood pressure, pulse, and                            oxygen saturations were monitored continuously. The                            Endoscope was introduced through the mouth, and                            advanced to the second part of duodenum. The upper                            GI endoscopy  was accomplished without difficulty.                            The patient tolerated the procedure well. Scope In: Scope Out: Findings:                 One moderate benign-appearing, intrinsic stenosis                            was found at the gastroesophageal junction. This                            measured 1.2 cm (inner diameter) and was traversed.                            A guidewire was placed and the scope was withdrawn.                            Dilations were performed with Savary dilators with                            mild resistance at 13 mm, 14 mm and 15 mm.                           LA Grade B (one or more mucosal breaks greater than                            5 mm, not extending between the tops of two mucosal                            folds) esophagitis with no bleeding was found in                            the distal esophagus.                           The exam of the esophagus was otherwise normal.  A small hiatal hernia was present.                           The exam of the stomach was otherwise normal.                           The duodenal bulb and second portion of the                            duodenum were normal. Complications:            No immediate complications. Estimated Blood Loss:     Estimated blood loss was minimal. Impression:               - Benign-appearing esophageal stenosis. Dilated.                           - LA Grade B reflux esophagitis.                           - Small hiatal hernia.                           - Normal duodenal bulb and second portion of the                            duodenum.                           - No specimens collected. Recommendation:           - Patient has a contact number available for                            emergencies. The signs and symptoms of potential                            delayed complications were discussed with the                            patient. Return to  normal activities tomorrow.                            Written discharge instructions were provided to the                            patient.                           - Clear liquid diet for 2 hours, then advance as                            tolerated to soft diet today. Resume prior diet                            tomorrow.                           -  Antireflux measures long term.                           - Continue present medications.                           - Return to GI office in 6 weeks. Meryl Dare, MD 02/13/2017 3:23:47 PM This report has been signed electronically.

## 2017-02-13 NOTE — Progress Notes (Signed)
Called to room to assist during endoscopic procedure.  Patient ID and intended procedure confirmed with present staff. Received instructions for my participation in the procedure from the performing physician.  

## 2017-02-14 ENCOUNTER — Telehealth: Payer: Self-pay | Admitting: *Deleted

## 2017-02-14 NOTE — Telephone Encounter (Signed)
  Follow up Call-  Call back number 02/13/2017  Post procedure Call Back phone  # [phone]  Permission to leave phone message Yes  Some recent data might be hidden     Patient questions:  Do you have a fever, pain , or abdominal swelling? No. Pain Score  0 *  Have you tolerated food without any problems? Yes.    Have you been able to return to your normal activities? Yes.    Do you have any questions about your discharge instructions: Diet   No. Medications  No. Follow up visit  No.  Do you have questions or concerns about your Care? No.  Actions: * If pain score is 4 or above: No action needed, pain <4.

## 2017-02-21 ENCOUNTER — Ambulatory Visit: Payer: Self-pay | Admitting: Family Medicine

## 2017-02-22 ENCOUNTER — Other Ambulatory Visit: Payer: Self-pay | Admitting: Family Medicine

## 2017-03-21 ENCOUNTER — Encounter: Payer: Self-pay | Admitting: Physician Assistant

## 2017-03-21 ENCOUNTER — Ambulatory Visit (INDEPENDENT_AMBULATORY_CARE_PROVIDER_SITE_OTHER): Payer: Managed Care, Other (non HMO) | Admitting: Physician Assistant

## 2017-03-21 VITALS — BP 120/78 | HR 67 | Ht 64.0 in | Wt 224.0 lb

## 2017-03-21 DIAGNOSIS — K219 Gastro-esophageal reflux disease without esophagitis: Secondary | ICD-10-CM

## 2017-03-21 DIAGNOSIS — R1310 Dysphagia, unspecified: Secondary | ICD-10-CM | POA: Diagnosis not present

## 2017-03-21 MED ORDER — RANITIDINE HCL 300 MG PO CAPS: 300.0000 mg | ORAL_CAPSULE | Freq: Two times a day (BID) | ORAL | 0 refills | Status: DC

## 2017-03-21 NOTE — Patient Instructions (Signed)
If you are age 47 or older, your body mass index should be between 23-30. Your Body mass index is 38.45 kg/m. If this is out of the aforementioned range listed, please consider follow up with your Primary Care Provider.  If you are age 47 or younger, your body mass index should be between 19-25. Your Body mass index is 38.45 kg/m. If this is out of the aformentioned range listed, please consider follow up with your Primary Care Provider.   You have been scheduled for a Barium Esophogram at [facility] (1st floor of the hospital) on 03/28/17 at 130 pm. Please arrive 15 minutes prior to your appointment for registration. Make certain not to have anything to eat or drink 3 hours prior to your test. If you need to reschedule for any reason, please contact radiology at [phone] to do so. __________________________________________________________________ A barium swallow is an examination that concentrates on views of the esophagus. This tends to be a double contrast exam (barium and two liquids which, when combined, create a gas to distend the wall of the oesophagus) or single contrast (non-ionic iodine based). The study is usually tailored to your symptoms so a good history is essential. Attention is paid during the study to the form, structure and configuration of the esophagus, looking for functional disorders (such as aspiration, dysphagia, achalasia, motility and reflux) EXAMINATION You may be asked to change into a gown, depending on the type of swallow being performed. A radiologist and radiographer will perform the procedure. The radiologist will advise you of the type of contrast selected for your procedure and direct you during the exam. You will be asked to stand, sit or lie in several different positions and to hold a small amount of fluid in your mouth before being asked to swallow while the imaging is performed .In some instances you may be asked to swallow barium coated marshmallows  to assess the motility of a solid food bolus. The exam can be recorded as a digital or video fluoroscopy procedure. POST PROCEDURE It will take 1-2 days for the barium to pass through your system. To facilitate this, it is important, unless otherwise directed, to increase your fluids for the next 24-48hrs and to resume your normal diet.  This test typically takes about 30 minutes to perform. __________________________________________________________________________________ We have sent the following medications to your pharmacy for you to pick up at your convenience: Ranitidine 300 mg twice daily.  You have been given Dysphagia literature.  Follow up with Dr. Russella DarStark or Hyacinth MeekerJennifer Lemmon, PA if needed.  Thank you for choosing me and [facility].   Hyacinth MeekerJennifer Lemmon, PA-C

## 2017-03-21 NOTE — Progress Notes (Signed)
Reviewed and agree with initial management plan.  Hannah Buchanan T. Kaitland Lewellyn, MD FACG 

## 2017-03-21 NOTE — Progress Notes (Signed)
Chief Complaint: Dysphagia, GERD  HPI:  Hannah Buchanan is a 46 year old Caucasian female with a past medical history of esophageal stricture, GERD, hiatal hernia and others listed below, who follows with Dr. Russella Dar and presents to clinic today for follow-up of her dysphagia and GERD.    Patient was last seen in clinic on 01/08/17 by Dr. Russella Dar and at that time describe difficulty swallowing and reflux symptoms. Patient had been seen the previous July for the same complaints and her symptoms hadresolved after increasing Omeprazole to 40 mg daily. Patient felt that at that time the Omeprazole negatively impacted her memory so she discontinued all of her acid reducing medications. Since then she had frequent heartburn and ongoing solid food dysphagia particularly to bread and meat for the past several months. She had a history of esophageal stricture and small hiatal hernia and underwent EGD with dilation in 2011. Patient was scheduled for another EGD at that time as well as started on Ranitidine 300 mg twice a day.     Patient had EGD on 02/13/17 with findings of a benign-appearing esophageal stenosis dilated to 15 mm, LA grade B esophagitis in the distal esophagus, small hiatal hernia and otherwise normal exam.   Today, patient presents to clinic and tells me that she has had no relief of her swallowing difficulty since time of EGD. In fact, she feels like her reflux has worsened since time of dilation and that now foods are "getting stuck in the top of my throat". She recalls multiple instances where food has gotten stuck over the past month and tells me that when something does get stuck it causes her to panic and she feels like this makes it worse. This is worse with breads and meats, and she has started to avoid these foods. She has had episodes of regurgitation. Patient tells me she was unaware that she was supposed to be on Ranitidine 300 mg twice a day and instead was taking this medication 150 mg twice a day.  Again the patient recalls that being on Omeprazole at a higher dose of 40 mg daily seem to give her memory deficits. She does not want to restart this medication.   Patient denies fever, chills, blood in her stool, melena, weight loss, fatigue, anorexia, change in bowel habits, nausea, vomiting or abdominal pain.   Past Medical History:  Diagnosis Date  . Allergy    Claritin daily yearround.  . Esophageal stricture   . GERD (gastroesophageal reflux disease)   . Hiatal hernia   . Hypertension   . Pneumonia   . Recurrent cold sores   . Status post dilation of esophageal narrowing     Past Surgical History:  Procedure Laterality Date  . Admission  10/09/1998   prolonged syncopal event; admission for 24 hours.  [place]  . BLADDER REPAIR  1998   [facility].  Marland Kitchen BREAST SURGERY  1999   breast reduction    Current Outpatient Prescriptions  Medication Sig Dispense Refill  . loratadine (CLARITIN) 10 MG tablet Take 10 mg by mouth daily.    . Magnesium 400 MG CAPS Take 1-2 capsules by mouth daily. As needed    . meloxicam (MOBIC) 7.5 MG tablet Take 7.5 mg by mouth daily. Can take 15 mg as needed    . metoprolol succinate (TOPROL-XL) 25 MG 24 hr tablet TAKE 1 AND 1/2 TABLETS( 37.5 MG) BY MOUTH DAILY 45 tablet 0  . nitroGLYCERIN (NITROSTAT) 0.4 MG SL tablet Place 0.4 mg  under the tongue every 5 (five) minutes as needed for chest pain.    . ranitidine (ZANTAC) 300 MG capsule Take 1 capsule (300 mg total) by mouth 2 (two) times daily. 60 capsule 11  . valACYclovir (VALTREX) 500 MG tablet Take 500 mg by mouth as needed.    Marland Kitchen albuterol (PROVENTIL HFA;VENTOLIN HFA) 108 (90 BASE) MCG/ACT inhaler Inhale 2 puffs into the lungs every 6 (six) hours as needed for wheezing. 1 Inhaler 0   Current Facility-Administered Medications  Medication Dose Route Frequency Provider Last Rate Last Dose  . 0.9 %  sodium chloride infusion  500 mL Intravenous Continuous Meryl Dare, MD         Allergies as of 03/21/2017 - Review Complete 03/21/2017  Allergen Reaction Noted  . Wheat bran  01/08/2017    Family History  Problem Relation Age of Onset  . Uterine cancer Maternal Grandmother   . Cancer Maternal Grandmother   . Colon cancer Father   . Colon polyps Father   . Diabetes Father   . Cancer Father 84       colon cancer  . Arthritis Father        TKR  . Arthritis Mother   . Asthma Mother   . Depression Sister   . Asthma Sister   . Migraines Sister   . Alcohol abuse Brother   . Depression Brother   . Thyroid cancer Maternal Aunt   . Cancer Maternal Uncle        unknown origin-Mets    Social History   Social History  . Marital status: Married    Spouse name: N/A  . Number of children: 2  . Years of education: N/A   Occupational History  . medical assistant [employer]   Social History Main Topics  . Smoking status: Never Smoker  . Smokeless tobacco: Never Used  . Alcohol use Yes     Comment: occasional  . Drug use: No  . Sexual activity: Yes   Other Topics Concern  . Not on file   Social History Narrative   Marital status: married x 24 years; happily      Chldren: 2 children (20, 59); no grandchldren.      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    Review of Systems:    Constitutional: No weight loss, fever, chills, weakness or fatigue Cardiovascular: No chest pain Respiratory: No SOB  Gastrointestinal: See HPI and otherwise negative   Physical Exam:  Vital signs: BP 120/78   Pulse 67   Ht 5\' 4"  (1.626 m)   Wt 224 lb (101.6 kg)   SpO2 97%   BMI 38.45 kg/m   Constitutional:   Pleasant overweight Caucasian female appears to be in NAD, Well developed, Well nourished, alert and cooperative Respiratory: Respirations even and unlabored. Lungs clear  to auscultation bilaterally.   No wheezes, crackles, or rhonchi.  Cardiovascular: Normal S1, S2. No MRG. Regular rate and rhythm. No peripheral edema, cyanosis or pallor.  Gastrointestinal:  Soft, nondistended, nontender. No rebound or guarding. Normal bowel sounds. No appreciable masses or hepatomegaly. Psychiatric:  Demonstrates good judgement and reason without abnormal affect or behaviors.  No recent labs or imaging.  Assessment: 1. Dysphagia: Patient continues with foods getting stuck in her throat, though it seems "higher up now"; consider relation to esophagitis versus less likely stricture versus ring versus web versus mass versus web versus dysmotility 2. GERD: Increased over the past month since time of dilation, patient has only been using Ranitidine 150 mg twice a day instead of 300 mg twice a day  Plan: 1. Ordered barium esophagogram with tablet for further evaluation of continued dysphagia. 2. Re-prescribed Ranitidine 300 mg twice a day. Discussed this dosing with the patient. She would like to trial this before trying a different PPI. 3. Patient was instructed to call our clinic in a week. If she does not feel like symptoms of reflux have improved with Ranitidine as above, would recommend that we prescribe Pantoprazole 40 mg daily, 30-60 minutes before breakfast.  4. Reviewed anti-dysphagia measures including taking small bites, chewing well, avoiding distraction while eating and the chin tuck technique. Also provided the patient with a dysphagia diet handout. Instructed her to follow #4 (modified regular diet), if she has problems with this she can follow #3 5. Patient to follow in clinic per recommendations after imaging as above with Dr. Russella DarStark or myself.  Hyacinth MeekerJennifer Sahira Cataldi, PA-C [facility] 03/21/2017, 1:32 PM  Cc: Ethelda ChickSmith, Kristi M, MD

## 2017-03-24 ENCOUNTER — Other Ambulatory Visit: Payer: Self-pay | Admitting: Family Medicine

## 2017-03-25 NOTE — Telephone Encounter (Signed)
I approved ONLY a 30 day supply of Metoprolol for patient as she is overdue for follow-up; please schedule a follow-up visit with me in upcoming month.

## 2017-03-28 ENCOUNTER — Ambulatory Visit (HOSPITAL_COMMUNITY): Payer: Managed Care, Other (non HMO)

## 2017-04-05 ENCOUNTER — Ambulatory Visit (HOSPITAL_COMMUNITY)
Admission: RE | Admit: 2017-04-05 | Discharge: 2017-04-05 | Disposition: A | Discharge status: Home / Self Care | Payer: Managed Care, Other (non HMO) | Source: Ambulatory Visit | Attending: Physician Assistant | Admitting: Physician Assistant

## 2017-04-05 DIAGNOSIS — K219 Gastro-esophageal reflux disease without esophagitis: Secondary | ICD-10-CM | POA: Diagnosis present

## 2017-04-05 DIAGNOSIS — K449 Diaphragmatic hernia without obstruction or gangrene: Secondary | ICD-10-CM | POA: Insufficient documentation

## 2017-04-05 DIAGNOSIS — K222 Esophageal obstruction: Secondary | ICD-10-CM | POA: Insufficient documentation

## 2017-04-05 DIAGNOSIS — R1310 Dysphagia, unspecified: Secondary | ICD-10-CM | POA: Diagnosis not present

## 2017-04-06 ENCOUNTER — Telehealth: Payer: Self-pay

## 2017-04-06 NOTE — Telephone Encounter (Signed)
Increase ranitidine to 300 mg po bid. If symptoms not well controlled in 3-4 week we can try a different PPI than she was taking previously.

## 2017-04-06 NOTE — Telephone Encounter (Signed)
I notified the patient of the results of the BS.  She says that her swallowing has not really changed, and she really only has trouble with dry foods or chicken.  She does not feel she is ready to repeat the EGD at this point.  She is not on a PPI.  She is taking Ranitidine 300 mg q HS.  She had some memory issues she felt were related to long term use of omeprazole, however she doesn't feel that the ranitidine is helping. Dr. Russella DarStark she said she is willing to take a PPI if you feel it would help. Please advise. She is leaving for vacation today and understands that I will call her when Dr. Russella DarStark has a chance to review when she returns.

## 2017-04-06 NOTE — Telephone Encounter (Signed)
Patient notified My previous note said she was taking 300 mg q HS, but she has been taking BID now for about 1 week.  She will try for the next few weeks and call me back if sh has continued symptoms

## 2017-04-06 NOTE — Telephone Encounter (Signed)
-----   Message from Meryl DareMalcolm T Stark, MD sent at 04/05/2017  4:20 PM EDT ----- See Eustaquio MaizeJL office note. If pt is still having dysphagia despite taking PPI daily we can repeat EGD with dilation with a larger size dilator. If dysphagia improved/resolved with regular daily PPI use no need to repeat EGD.     ----- Message ----- From: Brandt Loosenaylor, Patty L, RN Sent: 04/05/2017   3:18 PM To: Meryl DareMalcolm T Stark, MD, Violet BaldyJennifer Lynne KerhonksonLemmon, [place]

## 2017-04-25 ENCOUNTER — Other Ambulatory Visit: Payer: Self-pay | Admitting: Family Medicine

## 2017-05-03 ENCOUNTER — Other Ambulatory Visit: Payer: Self-pay | Admitting: Family Medicine

## 2017-05-18 ENCOUNTER — Ambulatory Visit (INDEPENDENT_AMBULATORY_CARE_PROVIDER_SITE_OTHER): Payer: Managed Care, Other (non HMO) | Admitting: Family Medicine

## 2017-05-18 ENCOUNTER — Encounter: Payer: Self-pay | Admitting: Family Medicine

## 2017-05-18 VITALS — BP 128/76 | HR 71 | Temp 98.1°F | Resp 17 | Ht 65.5 in | Wt 226.0 lb

## 2017-05-18 DIAGNOSIS — R413 Other amnesia: Secondary | ICD-10-CM | POA: Diagnosis not present

## 2017-05-18 DIAGNOSIS — K219 Gastro-esophageal reflux disease without esophagitis: Secondary | ICD-10-CM | POA: Diagnosis not present

## 2017-05-18 DIAGNOSIS — Z131 Encounter for screening for diabetes mellitus: Secondary | ICD-10-CM

## 2017-05-18 DIAGNOSIS — Z0000 Encounter for general adult medical examination without abnormal findings: Secondary | ICD-10-CM

## 2017-05-18 DIAGNOSIS — I10 Essential (primary) hypertension: Secondary | ICD-10-CM

## 2017-05-18 DIAGNOSIS — J301 Allergic rhinitis due to pollen: Secondary | ICD-10-CM | POA: Diagnosis not present

## 2017-05-18 LAB — POCT URINALYSIS DIP (MANUAL ENTRY)
BILIRUBIN UA: NEGATIVE mg/dL
Bilirubin, UA: NEGATIVE
Blood, UA: NEGATIVE
Glucose, UA: NEGATIVE mg/dL
LEUKOCYTES UA: NEGATIVE
Nitrite, UA: NEGATIVE
PH UA: 5.5 (ref 5.0–8.0)
PROTEIN UA: NEGATIVE mg/dL
Spec Grav, UA: 1.025 (ref 1.010–1.025)
UROBILINOGEN UA: 0.2 U/dL

## 2017-05-18 MED ORDER — METOPROLOL SUCCINATE ER 25 MG PO TB24: ORAL_TABLET | ORAL | 1 refills | Status: DC

## 2017-05-18 NOTE — Patient Instructions (Addendum)
   IF you received an x-ray today, you will receive an invoice from [facility]. Please contact [facility] at [phone] with questions or concerns regarding your invoice.   IF you received labwork today, you will receive an invoice from [facility]. Please contact [facility] at [phone] with questions or concerns regarding your invoice.   Our billing staff will not be able to assist you with questions regarding bills from these companies.  You will be contacted with the lab results as soon as they are available. The fastest way to get your results is to activate your My Chart account. Instructions are located on the last page of this paperwork. If you have not heard from us regarding the results in 2 weeks, please contact this office.      Fat and Cholesterol Restricted Diet Getting too much fat and cholesterol in your diet may cause health problems. Following this diet helps keep your fat and cholesterol at normal levels. This can keep you from getting sick. What types of fat should I choose?  Choose monosaturated and polyunsaturated fats. These are found in foods such as olive oil, canola oil, flaxseeds, walnuts, almonds, and seeds.  Eat more omega-3 fats. Good choices include salmon, mackerel, sardines, tuna, flaxseed oil, and ground flaxseeds.  Limit saturated fats. These are in animal products such as meats, butter, and cream. They can also be in plant products such as palm oil, palm kernel oil, and coconut oil.  Avoid foods with partially hydrogenated oils in them. These contain trans fats. Examples of foods that have trans fats are stick margarine, some tub margarines, cookies, crackers, and other baked goods. What general guidelines do I need to follow?  Check food labels. Look for the words "trans fat" and "saturated fat."  When preparing a meal: ? Fill half of your plate with vegetables and green salads. ? Fill one fourth of your plate with whole  grains. Look for the word "whole" as the first word in the ingredient list. ? Fill one fourth of your plate with lean protein foods.  Eat more foods that have fiber, like apples, carrots, beans, peas, and barley.  Eat more home-cooked foods. Eat less at restaurants and buffets.  Limit or avoid alcohol.  Limit foods high in starch and sugar.  Limit fried foods.  Cook foods without frying them. Baking, boiling, grilling, and broiling are all great options.  Lose weight if you are overweight. Losing even a small amount of weight can help your overall health. It can also help prevent diseases such as diabetes and heart disease. What foods can I eat? Grains Whole grains, such as whole wheat or whole grain breads, crackers, cereals, and pasta. Unsweetened oatmeal, bulgur, barley, quinoa, or brown rice. Corn or whole wheat flour tortillas. Vegetables Fresh or frozen vegetables (raw, steamed, roasted, or grilled). Green salads. Fruits All fresh, canned (in natural juice), or frozen fruits. Meat and Other Protein Products Ground beef (85% or leaner), grass-fed beef, or beef trimmed of fat. Skinless chicken or turkey. Ground chicken or turkey. Pork trimmed of fat. All fish and seafood. Eggs. Dried beans, peas, or lentils. Unsalted nuts or seeds. Unsalted canned or dry beans. Dairy Low-fat dairy products, such as skim or 1% milk, 2% or reduced-fat cheeses, low-fat ricotta or cottage cheese, or plain low-fat yogurt. Fats and Oils Tub margarines without trans fats. Light or reduced-fat mayonnaise and salad dressings. Avocado. Olive, canola, sesame, or safflower oils. Natural peanut or almond butter (choose ones without   added sugar and oil). The items listed above may not be a complete list of recommended foods or beverages. Contact your dietitian for more options. What foods are not recommended? Grains White bread. White pasta. White rice. Cornbread. Bagels, pastries, and croissants. Crackers that  contain trans fat. Vegetables White potatoes. Corn. Creamed or fried vegetables. Vegetables in a cheese sauce. Fruits Dried fruits. Canned fruit in light or heavy syrup. Fruit juice. Meat and Other Protein Products Fatty cuts of meat. Ribs, chicken wings, bacon, sausage, bologna, salami, chitterlings, fatback, hot dogs, bratwurst, and packaged luncheon meats. Liver and organ meats. Dairy Whole or 2% milk, cream, half-and-half, and cream cheese. Whole milk cheeses. Whole-fat or sweetened yogurt. Full-fat cheeses. Nondairy creamers and whipped toppings. Processed cheese, cheese spreads, or cheese curds. Sweets and Desserts Corn syrup, sugars, honey, and molasses. Candy. Jam and jelly. Syrup. Sweetened cereals. Cookies, pies, cakes, donuts, muffins, and ice cream. Fats and Oils Butter, stick margarine, lard, shortening, ghee, or bacon fat. Coconut, palm kernel, or palm oils. Beverages Alcohol. Sweetened drinks (such as sodas, lemonade, and fruit drinks or punches). The items listed above may not be a complete list of foods and beverages to avoid. Contact your dietitian for more information. This information is not intended to replace advice given to you by your health care provider. Make sure you discuss any questions you have with your health care provider. Document Released: 03/26/2012 Document Revised: 06/01/2016 Document Reviewed: 12/25/2013 Elsevier Interactive Patient Education  2018 [employer]  

## 2017-05-18 NOTE — Progress Notes (Signed)
Subjective:    Patient ID: Hannah Buchanan, female    DOB: 1971-06-05, 46 y.o.   MRN: [id number]  05/18/2017  Annual Exam (refill Metoprolol)   HPI This 46 y.o. female presents for Complete Physical examination and for evaluation of hypertension.  Referred to cardiology at last visit for syncopal event.  Saw Dr. Einar Gip.  Heart rate 52 at work a few days.  Usually in 70s-80s.    Niece committed suicide.  Daughter moved for [place]; [employer].  Dysphagia: seeing Dr. Fuller Plan a lot.  Zantac 350m bid; had horrible memory issues with PPI.  OA B: Meloxicam 172mdaily.  Last physical:  never Pap smear:  04/07/17 [place] Mammogram:  04/07/17 [place] Colonoscopy:  never Eye exam:  Due terribly; needs to go. Dental exam:  Hates the dentist  BP Readings from Last 3 Encounters:  05/18/17 128/76  03/21/17 120/78  02/13/17 106/67   Wt Readings from Last 3 Encounters:  05/18/17 226 lb (102.5 kg)  03/21/17 224 lb (101.6 kg)  02/13/17 222 lb (100.7 kg)    There is no immunization history on file for this patient.  Review of Systems  Constitutional: Positive for fatigue. Negative for activity change, appetite change, chills, diaphoresis, fever and unexpected weight change.  HENT: Negative for congestion, dental problem, drooling, ear discharge, ear pain, facial swelling, hearing loss, mouth sores, nosebleeds, postnasal drip, rhinorrhea, sinus pressure, sneezing, sore throat, tinnitus, trouble swallowing and voice change.   Eyes: Negative for photophobia, pain, discharge, redness, itching and visual disturbance.  Respiratory: Negative for apnea, cough, choking, chest tightness, shortness of breath, wheezing and stridor.   Cardiovascular: Negative for chest pain, palpitations and leg swelling.  Gastrointestinal: Negative for abdominal distention, abdominal pain, anal bleeding, blood in stool, constipation, diarrhea, nausea, rectal pain and vomiting.  Endocrine: Negative for cold intolerance,  heat intolerance, polydipsia, polyphagia and polyuria.  Genitourinary: Negative for decreased urine volume, difficulty urinating, dyspareunia, dysuria, enuresis, flank pain, frequency, genital sores, hematuria, menstrual problem, pelvic pain, urgency, vaginal bleeding, vaginal discharge and vaginal pain.       Nocturia x 1.  Urinary leakage yes; small pad.     Musculoskeletal: Negative for arthralgias, back pain, gait problem, joint swelling, myalgias, neck pain and neck stiffness.  Skin: Negative for color change, pallor, rash and wound.  Allergic/Immunologic: Negative for environmental allergies, food allergies and immunocompromised state.  Neurological: Negative for dizziness, tremors, seizures, syncope, facial asymmetry, speech difficulty, weakness, light-headedness, numbness and headaches.  Hematological: Negative for adenopathy. Does not bruise/bleed easily.  Psychiatric/Behavioral: Negative for agitation, behavioral problems, confusion, decreased concentration, dysphoric mood, hallucinations, self-injury, sleep disturbance and suicidal ideas. The patient is not nervous/anxious and is not hyperactive.        Bedtime 11:00pm; wakes up 6:30am.    Past Medical History:  Diagnosis Date  . Allergy    Claritin daily yearround.  . Esophageal stricture   . GERD (gastroesophageal reflux disease)   . Hiatal hernia   . Hypertension   . Pneumonia   . Recurrent cold sores   . Status post dilation of esophageal narrowing    Past Surgical History:  Procedure Laterality Date  . Admission  10/09/1998   prolonged syncopal event; admission for 24 hours.  Bud  . BLADDER REPAIR  1998   David Lowe/Gynecology.  . Marland KitchenREAST SURGERY  1999   breast reduction   Allergies  Allergen Reactions  . Wheat Bran     Social History   Social History  .  Marital status: Married    Spouse name: N/A  . Number of children: 2  . Years of education: N/A   Occupational History  . medical assistant  [employer]   Social History Main Topics  . Smoking status: Never Smoker  . Smokeless tobacco: Never Used  . Alcohol use Yes     Comment: occasional  . Drug use: No  . Sexual activity: Yes   Other Topics Concern  . Not on file   Social History Narrative   Marital status: married x 26 years; happily      Chldren: 2 children (62, 47); no grandchldren.      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   Family History  Problem Relation Age of Onset  . Uterine cancer Maternal Grandmother   . Cancer Maternal Grandmother   . Colon cancer Father   . Colon polyps Father   . Diabetes Father   . Cancer Father 10       colon cancer  . Arthritis Father        TKR B  . Arthritis Mother   . Asthma Mother   . Depression Sister   . Asthma Sister   . Migraines Sister   . Alcohol abuse Brother   . Depression Brother   . Thyroid cancer Maternal Aunt   . Cancer Maternal Uncle        unknown origin-Mets       Objective:    BP 128/76   Pulse 71   Temp 98.1 F (36.7 C) (Oral)   Resp 17   Ht 5' 5.5" (1.664 m)   Wt 226 lb (102.5 kg)   LMP 04/06/2017 (Approximate)   SpO2 98%   BMI 37.04 kg/m  Physical Exam  Constitutional: She is oriented to person, place, and time. She appears well-developed and well-nourished. No distress.  HENT:  Head: Normocephalic and atraumatic.  Right Ear: External ear normal.  Left Ear: External ear normal.  Nose: Nose normal.  Mouth/Throat: Oropharynx is clear and moist.  Eyes: Pupils are equal, round, and reactive to light. Conjunctivae and EOM are normal.  Neck: Normal range of motion and full passive range of motion without pain. Neck supple. No JVD present. Carotid bruit is not present. No thyromegaly present.  Cardiovascular: Normal rate,  regular rhythm and normal heart sounds.  Exam reveals no gallop and no friction rub.   No murmur heard. Pulmonary/Chest: Effort normal and breath sounds normal. She has no wheezes. She has no rales.  Abdominal: Soft. Bowel sounds are normal. She exhibits no distension and no mass. There is no tenderness. There is no rebound and no guarding.  Musculoskeletal:       Right shoulder: Normal.       Left shoulder: Normal.       Cervical back: Normal.  Lymphadenopathy:    She has no cervical adenopathy.  Neurological: She is alert and oriented to person, place, and time. She has normal reflexes. No cranial nerve deficit. She exhibits normal muscle tone. Coordination normal.  Skin: Skin is warm and dry. No rash noted. She is not diaphoretic. No erythema. No pallor.  Psychiatric: She has a normal mood  and affect. Her behavior is normal. Judgment and thought content normal.  Nursing note and vitals reviewed.  Results for orders placed or performed in visit on 12/28/15  CBC with Differential/Platelet  Result Value Ref Range   WBC 6.7 4.0 - 10.5 K/uL   RBC 4.54 3.87 - 5.11 MIL/uL   Hemoglobin 13.8 12.0 - 15.0 g/dL   HCT 40.2 36.0 - 46.0 %   MCV 88.5 78.0 - 100.0 fL   MCH 30.4 26.0 - 34.0 pg   MCHC 34.3 30.0 - 36.0 g/dL   RDW 13.4 11.5 - 15.5 %   Platelets 200 150 - 400 K/uL   MPV 12.2 8.6 - 12.4 fL   Neutrophils Relative % 56 43 - 77 %   Neutro Abs 3.8 1.7 - 7.7 K/uL   Lymphocytes Relative 34 12 - 46 %   Lymphs Abs 2.3 0.7 - 4.0 K/uL   Monocytes Relative 5 3 - 12 %   Monocytes Absolute 0.3 0.1 - 1.0 K/uL   Eosinophils Relative 5 0 - 5 %   Eosinophils Absolute 0.3 0.0 - 0.7 K/uL   Basophils Relative 0 0 - 1 %   Basophils Absolute 0.0 0.0 - 0.1 K/uL   Smear Review Criteria for review not met   Comprehensive metabolic panel  Result Value Ref Range   Sodium 139 135 - 146 mmol/L   Potassium 4.3 3.5 - 5.3 mmol/L   Chloride 97 (L) 98 - 110 mmol/L   CO2 25 20 - 31 mmol/L   Glucose, Bld 90 65  - 99 mg/dL   BUN 23 7 - 25 mg/dL   Creat 0.84 0.50 - 1.10 mg/dL   Total Bilirubin 0.7 0.2 - 1.2 mg/dL   Alkaline Phosphatase 75 33 - 115 U/L   AST 17 10 - 35 U/L   ALT 17 6 - 29 U/L   Total Protein 7.2 6.1 - 8.1 g/dL   Albumin 4.6 3.6 - 5.1 g/dL   Calcium 9.7 8.6 - 10.2 mg/dL  TSH  Result Value Ref Range   TSH 1.78 mIU/L  HIV antibody  Result Value Ref Range   HIV 1&2 Ab, 4th Generation NONREACTIVE NONREACTIVE  POCT urinalysis dipstick  Result Value Ref Range   Color, UA yellow yellow   Clarity, UA clear clear   Glucose, UA negative negative   Bilirubin, UA negative negative   Ketones, POC UA negative negative   Spec Grav, UA 1.015    Blood, UA negative negative   pH, UA 7.0    Protein Ur, POC negative negative   Urobilinogen, UA 0.2    Nitrite, UA Negative Negative   Leukocytes, UA Negative Negative  POCT Microscopic Urinalysis ([facility])  Result Value Ref Range   WBC,UR,HPF,POC None None WBC/hpf   RBC,UR,HPF,POC None None RBC/hpf   Bacteria None None, Too numerous to count   Mucus Absent Absent   Epithelial Cells, UR Per Microscopy Few (A) None, Too numerous to count cells/hpf   No results found. Depression screen [facility] 2/9 05/18/2017 12/28/2015 05/19/2015 11/18/2014  Decreased Interest 0 0 0 0  Down, Depressed, Hopeless 0 0 0 0  PHQ - 2 Score 0 0 0 0   Fall Risk  05/18/2017 12/28/2015 05/19/2015 11/18/2014  Falls in the past year? No Yes Yes No  Number falls in past yr: - 2 or more 2 or more -  Injury with Fall? - - No -        Assessment & Plan:   1.  Routine physical examination   2. Essential hypertension, benign   3. Seasonal allergic rhinitis due to pollen   4. Gastroesophageal reflux disease without esophagitis   5. Screening for diabetes mellitus   6. Memory loss    -anticipatory guidance provided --- exercise, weight loss, safe driving practices. -obtain age appropriate screening labs and labs for chronic disease management. -recommend weight loss, exercise  for 30-60 minutes five days per week; recommend 1200 kcal restriction per day with a minimum of 60 grams of protein per day. -suffering with fatigue; obtain TSH, Vitamin D, Vitamin B12; recommend sleep study if labs all normal. -suffering with poor memory; obtain labs.   -refill of Metoprolol provided. -check date of last Tetanus vaccine.   Orders Placed This Encounter  Procedures  . CBC with Differential/Platelet  . Comprehensive metabolic panel    Order Specific Question:   Has the patient fasted?    Answer:   Yes  . Hemoglobin A1c  . Lipid panel    Order Specific Question:   Has the patient fasted?    Answer:   Yes  . TSH  . VITAMIN D 25 Hydroxy (Vit-D Deficiency, Fractures)  . Vitamin B12  . POCT urinalysis dipstick  . EKG 12-Lead   Meds ordered this encounter  Medications  . metoprolol succinate (TOPROL-XL) 25 MG 24 hr tablet    Sig: TAKE 1 AND 1/2 TABLETS( 37.5 MG) BY MOUTH DAILY    Dispense:  135 tablet    Refill:  1    Return in about 6 months (around 11/18/2017) for recheck high blood pressure.   Nazair Fortenberry Elayne Guerin, M.D. Primary Care at [facility] [place] [address] [place], [place]  [address] [phone] phone [phone] fax

## 2017-05-19 LAB — LIPID PANEL
CHOL/HDL RATIO: 3.9 ratio (ref 0.0–4.4)
Cholesterol, Total: 205 mg/dL — ABNORMAL HIGH (ref 100–199)
HDL: 53 mg/dL (ref >39)
LDL CALC: 128 mg/dL — AB (ref 0–99)
Triglycerides: 120 mg/dL (ref 0–149)
VLDL CHOLESTEROL CAL: 24 mg/dL (ref 5–40)

## 2017-05-19 LAB — COMPREHENSIVE METABOLIC PANEL
A/G RATIO: 1.9 (ref 1.2–2.2)
ALBUMIN: 4.4 g/dL (ref 3.5–5.5)
ALT: 19 IU/L (ref 0–32)
AST: 18 IU/L (ref 0–40)
Alkaline Phosphatase: 69 IU/L (ref 39–117)
BUN / CREAT RATIO: 17 (ref 9–23)
BUN: 15 mg/dL (ref 6–24)
Bilirubin Total: 0.4 mg/dL (ref 0.0–1.2)
CALCIUM: 9.1 mg/dL (ref 8.7–10.2)
CO2: 22 mmol/L (ref 20–29)
Chloride: 102 mmol/L (ref 96–106)
Creatinine, Ser: 0.9 mg/dL (ref 0.57–1.00)
GFR, EST AFRICAN AMERICAN: 89 mL/min/{1.73_m2} (ref >59)
GFR, EST NON AFRICAN AMERICAN: 77 mL/min/{1.73_m2} (ref >59)
GLOBULIN, TOTAL: 2.3 g/dL (ref 1.5–4.5)
Glucose: 98 mg/dL (ref 65–99)
POTASSIUM: 4.7 mmol/L (ref 3.5–5.2)
Sodium: 139 mmol/L (ref 134–144)
TOTAL PROTEIN: 6.7 g/dL (ref 6.0–8.5)

## 2017-05-19 LAB — TSH: TSH: 1.96 u[IU]/mL (ref 0.450–4.500)

## 2017-05-19 LAB — CBC WITH DIFFERENTIAL/PLATELET
BASOS: 0 %
Basophils Absolute: 0 10*3/uL (ref 0.0–0.2)
EOS (ABSOLUTE): 0.2 10*3/uL (ref 0.0–0.4)
EOS: 3 %
HEMATOCRIT: 38.8 % (ref 34.0–46.6)
HEMOGLOBIN: 13 g/dL (ref 11.1–15.9)
Immature Grans (Abs): 0 10*3/uL (ref 0.0–0.1)
Immature Granulocytes: 0 %
LYMPHS ABS: 1.4 10*3/uL (ref 0.7–3.1)
Lymphs: 25 %
MCH: 30.9 pg (ref 26.6–33.0)
MCHC: 33.5 g/dL (ref 31.5–35.7)
MCV: 92 fL (ref 79–97)
MONOS ABS: 0.3 10*3/uL (ref 0.1–0.9)
Monocytes: 6 %
Neutrophils Absolute: 3.7 10*3/uL (ref 1.4–7.0)
Neutrophils: 66 %
PLATELETS: 171 10*3/uL (ref 150–379)
RBC: 4.21 x10E6/uL (ref 3.77–5.28)
RDW: 13.8 % (ref 12.3–15.4)
WBC: 5.6 10*3/uL (ref 3.4–10.8)

## 2017-05-19 LAB — HEMOGLOBIN A1C
Est. average glucose Bld gHb Est-mCnc: 97 mg/dL
Hgb A1c MFr Bld: 5 % (ref 4.8–5.6)

## 2017-05-19 LAB — VITAMIN D 25 HYDROXY (VIT D DEFICIENCY, FRACTURES): VIT D 25 HYDROXY: 27.1 ng/mL — AB (ref 30.0–100.0)

## 2017-05-19 LAB — VITAMIN B12: VITAMIN B 12: 579 pg/mL (ref 232–1245)

## 2017-05-28 ENCOUNTER — Encounter: Payer: Self-pay | Admitting: Physician Assistant

## 2017-05-28 ENCOUNTER — Encounter: Payer: Self-pay | Admitting: Family Medicine

## 2017-11-12 ENCOUNTER — Other Ambulatory Visit: Payer: Self-pay | Admitting: Family Medicine

## 2018-01-22 DIAGNOSIS — L508 Other urticaria: Secondary | ICD-10-CM | POA: Insufficient documentation

## 2018-02-10 ENCOUNTER — Other Ambulatory Visit: Payer: Self-pay | Admitting: Family Medicine

## 2018-02-12 NOTE — Telephone Encounter (Signed)
mychart message sent to pt about making an apt °

## 2018-02-28 ENCOUNTER — Encounter: Payer: Self-pay | Admitting: Family Medicine

## 2018-04-06 ENCOUNTER — Other Ambulatory Visit: Payer: Self-pay | Admitting: Gastroenterology

## 2018-05-08 ENCOUNTER — Other Ambulatory Visit: Payer: Self-pay | Admitting: Gastroenterology

## 2018-05-10 ENCOUNTER — Other Ambulatory Visit: Payer: Self-pay | Admitting: Family Medicine

## 2018-05-10 NOTE — Telephone Encounter (Signed)
Call to patient- left message to schedule annual exam so we can continue her BP medication prescription.

## 2018-05-16 ENCOUNTER — Other Ambulatory Visit: Payer: Self-pay | Admitting: Family Medicine

## 2018-05-16 NOTE — Telephone Encounter (Signed)
Metoprolol 25mg  tab refill. Pt requesting a 10 day refill until her appointment on 8/16. Pt states she will not have enough pills to last until appointment.  Last Refill:05/10/18 #45 Last OV: 05/18/17 Next OV: 05/24/18 with Kathrene AluBrittany Wiseman,PA PCP: former pt of Dr. Katrinka BlazingSmith Pharmacy: [facility] [address]

## 2018-05-16 NOTE — Telephone Encounter (Signed)
Copied from CRM [id number]. Topic: Quick Communication - Rx Refill/Question >> May 16, 2018 10:59 AM Tamela OddiHarris, Hannah Buchanan J wrote: Medication: metoprolol succinate (TOPROL-XL) 25 MG 24 hr tablet  Patient called to request a 10 day refill on the above medication.  Patient states that her appointment is on 05/24/18 and she will not have enough pills to last before that appointment.  CB# [phone]  Preferred Pharmacy (with phone number or street name): [id number] - [place], [place] - [address] AT [facility] [address] [phone] (Phone) [phone] (Fax)

## 2018-05-22 MED ORDER — METOPROLOL SUCCINATE ER 25 MG PO TB24: ORAL_TABLET | ORAL | 0 refills | Status: DC

## 2018-05-24 ENCOUNTER — Ambulatory Visit: Payer: BLUE CROSS/BLUE SHIELD | Admitting: Physician Assistant

## 2018-05-24 ENCOUNTER — Encounter: Payer: Self-pay | Admitting: Physician Assistant

## 2018-05-24 ENCOUNTER — Other Ambulatory Visit: Payer: Self-pay

## 2018-05-24 VITALS — BP 111/80 | HR 85 | Temp 99.0°F | Resp 20 | Ht 63.98 in | Wt 230.8 lb

## 2018-05-24 DIAGNOSIS — Z6839 Body mass index (BMI) 39.0-39.9, adult: Secondary | ICD-10-CM | POA: Diagnosis not present

## 2018-05-24 DIAGNOSIS — E559 Vitamin D deficiency, unspecified: Secondary | ICD-10-CM | POA: Diagnosis not present

## 2018-05-24 DIAGNOSIS — I10 Essential (primary) hypertension: Secondary | ICD-10-CM | POA: Diagnosis not present

## 2018-05-24 MED ORDER — METOPROLOL SUCCINATE ER 25 MG PO TB24: ORAL_TABLET | ORAL | 3 refills | Status: DC

## 2018-05-24 NOTE — Progress Notes (Signed)
MRN: [id number] DOB: 1970/11/20  Subjective:   Hannah Buchanan is a 47 y.o. female presenting for follow up on Hypertension.   Currently managed with metoprolol XL 37.68m daily. Patient is checking blood pressure at home, range is 1856/31systolic. Denies lightheadedness, dizziness, chronic headache, double vision, chest pain, shortness of breath, heart racing, palpitations, nausea, vomiting, abdominal pain, hematuria, lower leg swelling. Lifestyle: Avoiding excessive salt intake. Gluten free. Eats lots of salads.  Trying to exercise on a regular basis, works out at gym after work at least 2 days a week.  Has struggled with weight loss over the years.  Denies smoking, occassional alchol use. Denies any other aggravating or relieving factors, no other questions or concerns.    Hannah Buchanan has a current medication list which includes the following prescription(s): loratadine, magnesium, meloxicam, metoprolol succinate, nitroglycerin, ranitidine, ranitidine, valacyclovir, and albuterol, and the following Facility-Administered Medications: sodium chloride. Also is allergic to wheat bran.  Hannah Buchanan  has a past medical history of Allergy, Esophageal stricture, GERD (gastroesophageal reflux disease), Hiatal hernia, Hypertension, Pneumonia, Recurrent cold sores, and Status post dilation of esophageal narrowing. Also  has a past surgical history that includes Bladder repair (1998); Breast surgery (1999); and Admission (10/09/1998).   Objective:   Vitals: BP 111/80   Pulse 85   Temp 99 F (37.2 C) (Oral)   Resp 20   Ht 5' 3.98" (1.625 m)   Wt 230 lb 12.8 oz (104.7 kg)   LMP 04/16/2018 (Approximate)   SpO2 98%   BMI 39.65 kg/m   Physical Exam  Constitutional: She is oriented to person, place, and time. She appears well-developed and well-nourished. No distress.  HENT:  Head: Normocephalic and atraumatic.  Mouth/Throat: Uvula is midline, oropharynx is clear and moist and mucous membranes are normal.    Eyes: Pupils are equal, round, and reactive to light. Conjunctivae and EOM are normal.  Neck: Normal range of motion.  Cardiovascular: Normal rate, regular rhythm, normal heart sounds and intact distal pulses.  Pulmonary/Chest: Effort normal and breath sounds normal. She has no wheezes. She has no rhonchi. She has no rales.  Musculoskeletal:       Right lower leg: She exhibits no swelling.       Left lower leg: She exhibits no swelling.  Neurological: She is alert and oriented to person, place, and time.  Skin: Skin is warm and dry.  Psychiatric: She has a normal mood and affect.  Vitals reviewed.   No results found for this or any previous visit (from the past 24 hour(s)). BP Readings from Last 3 Encounters:  05/24/18 111/80  05/18/17 128/76  03/21/17 120/78    Wt Readings from Last 3 Encounters:  05/24/18 230 lb 12.8 oz (104.7 kg)  05/18/17 226 lb (102.5 kg)  03/21/17 224 lb (101.6 kg)    Assessment and Plan :  1. Essential hypertension, benign Well controlled. Labs pending. Continue current medication regimen. F/u in one year or as needed.  - CBC with Differential/Platelet - CMP14+EGFR - Lipid panel - Urinalysis, dipstick only  2. Vitamin D deficiency - VITAMIN D 25 Hydroxy (Vit-D Deficiency, Fractures)  3. Class 2 severe obesity with serious comorbidity and body mass index (BMI) of 39.0 to 39.9 in adult, unspecified obesity type ([place] Pt has gained 4 lbs over the past year. Discussed weight loss with pt via diet and exercise. Overall has healthy diet. Rec focus on decreased snacking. Increase exercise per week. Rec free app such as myfittnesspal or  carbmanager to track calories and energy expenditure. Discussed referral to weight management. Pt would like to try lifestyle modifications first and then if no improvement, will contact me to place referral.     Hannah Delaine, PA-C  Primary Care at [facility] 05/24/2018 9:18 AM

## 2018-05-24 NOTE — Patient Instructions (Addendum)
Follow up in one year or as needed.   DASH Eating Plan DASH stands for "Dietary Approaches to Stop Hypertension." The DASH eating plan is a healthy eating plan that has been shown to reduce high blood pressure (hypertension). It may also reduce your risk for type 2 diabetes, heart disease, and stroke. The DASH eating plan may also help with weight loss. What are tips for following this plan? General guidelines  Avoid eating more than 2,300 mg (milligrams) of salt (sodium) a day. If you have hypertension, you may need to reduce your sodium intake to 1,500 mg a day.  Limit alcohol intake to no more than 1 drink a day for nonpregnant women and 2 drinks a day for men. One drink equals 12 oz of beer, 5 oz of wine, or 1 oz of hard liquor.  Work with your health care provider to maintain a healthy body weight or to lose weight. Ask what an ideal weight is for you.  Get at least 30 minutes of exercise that causes your heart to beat faster (aerobic exercise) most days of the week. Activities may include walking, swimming, or biking.  Work with your health care provider or diet and nutrition specialist (dietitian) to adjust your eating plan to your individual calorie needs. Reading food labels  Check food labels for the amount of sodium per serving. Choose foods with less than 5 percent of the Daily Value of sodium. Generally, foods with less than 300 mg of sodium per serving fit into this eating plan.  To find whole grains, look for the word "whole" as the first word in the ingredient list. Shopping  Buy products labeled as "low-sodium" or "no salt added."  Buy fresh foods. Avoid canned foods and premade or frozen meals. Cooking  Avoid adding salt when cooking. Use salt-free seasonings or herbs instead of table salt or sea salt. Check with your health care provider or pharmacist before using salt substitutes.  Do not fry foods. Cook foods using healthy methods such as baking, boiling,  grilling, and broiling instead.  Cook with heart-healthy oils, such as olive, canola, soybean, or sunflower oil. Meal planning   Eat a balanced diet that includes: ? 5 or more servings of fruits and vegetables each day. At each meal, try to fill half of your plate with fruits and vegetables. ? Up to 6-8 servings of whole grains each day. ? Less than 6 oz of lean meat, poultry, or fish each day. A 3-oz serving of meat is about the same size as a deck of cards. One egg equals 1 oz. ? 2 servings of low-fat dairy each day. ? A serving of nuts, seeds, or beans 5 times each week. ? Heart-healthy fats. Healthy fats called Omega-3 fatty acids are found in foods such as flaxseeds and coldwater fish, like sardines, salmon, and mackerel.  Limit how much you eat of the following: ? Canned or prepackaged foods. ? Food that is high in trans fat, such as fried foods. ? Food that is high in saturated fat, such as fatty meat. ? Sweets, desserts, sugary drinks, and other foods with added sugar. ? Full-fat dairy products.  Do not salt foods before eating.  Try to eat at least 2 vegetarian meals each week.  Eat more home-cooked food and less restaurant, buffet, and fast food.  When eating at a restaurant, ask that your food be prepared with less salt or no salt, if possible. What foods are recommended? The items listed may not  be a complete list. Talk with your dietitian about what dietary choices are best for you. Grains Whole-grain or whole-wheat bread. Whole-grain or whole-wheat pasta. Brown rice. [place]. Bulgur. Whole-grain and low-sodium cereals. Pita bread. Low-fat, low-sodium crackers. Whole-wheat flour tortillas. Vegetables Fresh or frozen vegetables (raw, steamed, roasted, or grilled). Low-sodium or reduced-sodium tomato and vegetable juice. Low-sodium or reduced-sodium tomato sauce and tomato paste. Low-sodium or reduced-sodium canned vegetables. Fruits All fresh, dried, or frozen  fruit. Canned fruit in natural juice (without added sugar). Meat and other protein foods Skinless chicken or [place]. Ground chicken or [place]. Pork with fat trimmed off. Fish and seafood. Egg whites. Dried beans, peas, or lentils. Unsalted nuts, nut butters, and seeds. Unsalted canned beans. Lean cuts of beef with fat trimmed off. Low-sodium, lean deli meat. Dairy Low-fat (1%) or fat-free (skim) milk. Fat-free, low-fat, or reduced-fat cheeses. Nonfat, low-sodium ricotta or cottage cheese. Low-fat or nonfat yogurt. Low-fat, low-sodium cheese. Fats and oils Soft margarine without trans fats. Vegetable oil. Low-fat, reduced-fat, or light mayonnaise and salad dressings (reduced-sodium). Canola, safflower, olive, soybean, and sunflower oils. Avocado. Seasoning and other foods Herbs. Spices. Seasoning mixes without salt. Unsalted popcorn and pretzels. Fat-free sweets. What foods are not recommended? The items listed may not be a complete list. Talk with your dietitian about what dietary choices are best for you. Grains Baked goods made with fat, such as croissants, muffins, or some breads. Dry pasta or rice meal packs. Vegetables Creamed or fried vegetables. Vegetables in a cheese sauce. Regular canned vegetables (not low-sodium or reduced-sodium). Regular canned tomato sauce and paste (not low-sodium or reduced-sodium). Regular tomato and vegetable juice (not low-sodium or reduced-sodium). Angie Fava. Olives. Fruits Canned fruit in a light or heavy syrup. Fried fruit. Fruit in cream or butter sauce. Meat and other protein foods Fatty cuts of meat. Ribs. Fried meat. Berniece Salines. Sausage. Bologna and other processed lunch meats. Salami. Fatback. Hotdogs. Bratwurst. Salted nuts and seeds. Canned beans with added salt. Canned or smoked fish. Whole eggs or egg yolks. Chicken or [place] with skin. Dairy Whole or 2% milk, cream, and half-and-half. Whole or full-fat cream cheese. Whole-fat or sweetened yogurt. Full-fat  cheese. Nondairy creamers. Whipped toppings. Processed cheese and cheese spreads. Fats and oils Butter. Stick margarine. Lard. Shortening. Ghee. Bacon fat. Tropical oils, such as coconut, palm kernel, or palm oil. Seasoning and other foods Salted popcorn and pretzels. Onion salt, garlic salt, seasoned salt, table salt, and sea salt. Worcestershire sauce. Tartar sauce. Barbecue sauce. Teriyaki sauce. Soy sauce, including reduced-sodium. Steak sauce. Canned and packaged gravies. Fish sauce. Oyster sauce. Cocktail sauce. Horseradish that you find on the shelf. Ketchup. Mustard. Meat flavorings and tenderizers. Bouillon cubes. Hot sauce and Tabasco sauce. Premade or packaged marinades. Premade or packaged taco seasonings. Relishes. Regular salad dressings. Where to find more information:  National Heart, Lung, and [place]: https://wilson-eaton.com/  American Heart Association: www.heart.org Summary  The DASH eating plan is a healthy eating plan that has been shown to reduce high blood pressure (hypertension). It may also reduce your risk for type 2 diabetes, heart disease, and stroke.  With the DASH eating plan, you should limit salt (sodium) intake to 2,300 mg a day. If you have hypertension, you may need to reduce your sodium intake to 1,500 mg a day.  When on the DASH eating plan, aim to eat more fresh fruits and vegetables, whole grains, lean proteins, low-fat dairy, and heart-healthy fats.  Work with your health care provider or diet and nutrition  specialist (dietitian) to adjust your eating plan to your individual calorie needs. This information is not intended to replace advice given to you by your health care provider. Make sure you discuss any questions you have with your health care provider. Document Released: 09/14/2011 Document Revised: 09/18/2016 Document Reviewed: 09/18/2016 Elsevier Interactive Patient Education  [employer]2018 [employer] Inc.   If you have lab work done today you will be  contacted with your lab results within the next 2 weeks.  If you have not heard from us then please contact us. The fastest way to get your results is to register for My Chart.   IF you received an x-ray today, you will receive an invoice from [facility]. Please contact [facility] at [phone] with questions or concerns regarding your invoice.   IF you received labwork today, you will receive an invoice from [facility]. Please contact [facility] at [phone] with questions or concerns regarding your invoice.   Our billing staff will not be able to assist you with questions regarding bills from these companies.  You will be contacted with the lab results as soon as they are available. The fastest way to get your results is to activate your My Chart account. Instructions are located on the last page of this paperwork. If you have not heard from us regarding the results in 2 weeks, please contact this office.

## 2018-05-25 LAB — CBC WITH DIFFERENTIAL/PLATELET
BASOS ABS: 0 10*3/uL (ref 0.0–0.2)
Basos: 0 %
EOS (ABSOLUTE): 0.2 10*3/uL (ref 0.0–0.4)
Eos: 3 %
HEMOGLOBIN: 12.9 g/dL (ref 11.1–15.9)
Hematocrit: 39.9 % (ref 34.0–46.6)
Immature Grans (Abs): 0 10*3/uL (ref 0.0–0.1)
Immature Granulocytes: 0 %
LYMPHS ABS: 1.8 10*3/uL (ref 0.7–3.1)
Lymphs: 26 %
MCH: 31 pg (ref 26.6–33.0)
MCHC: 32.3 g/dL (ref 31.5–35.7)
MCV: 96 fL (ref 79–97)
MONOCYTES: 5 %
MONOS ABS: 0.4 10*3/uL (ref 0.1–0.9)
NEUTROS ABS: 4.3 10*3/uL (ref 1.4–7.0)
Neutrophils: 66 %
Platelets: 188 10*3/uL (ref 150–450)
RBC: 4.16 x10E6/uL (ref 3.77–5.28)
RDW: 14.1 % (ref 12.3–15.4)
WBC: 6.7 10*3/uL (ref 3.4–10.8)

## 2018-05-25 LAB — URINALYSIS, DIPSTICK ONLY
BILIRUBIN UA: NEGATIVE
Glucose, UA: NEGATIVE
Ketones, UA: NEGATIVE
LEUKOCYTES UA: NEGATIVE
Nitrite, UA: NEGATIVE
PH UA: 5 (ref 5.0–7.5)
PROTEIN UA: NEGATIVE
RBC, UA: NEGATIVE
Specific Gravity, UA: 1.02 (ref 1.005–1.030)
Urobilinogen, Ur: 0.2 mg/dL (ref 0.2–1.0)

## 2018-05-25 LAB — LIPID PANEL
Chol/HDL Ratio: 3.6 ratio (ref 0.0–4.4)
Cholesterol, Total: 189 mg/dL (ref 100–199)
HDL: 52 mg/dL (ref >39)
LDL Calculated: 116 mg/dL — ABNORMAL HIGH (ref 0–99)
TRIGLYCERIDES: 107 mg/dL (ref 0–149)
VLDL CHOLESTEROL CAL: 21 mg/dL (ref 5–40)

## 2018-05-25 LAB — CMP14+EGFR
ALK PHOS: 77 IU/L (ref 39–117)
ALT: 21 IU/L (ref 0–32)
AST: 18 IU/L (ref 0–40)
Albumin/Globulin Ratio: 2 (ref 1.2–2.2)
Albumin: 4.3 g/dL (ref 3.5–5.5)
BILIRUBIN TOTAL: 0.4 mg/dL (ref 0.0–1.2)
BUN/Creatinine Ratio: 11 (ref 9–23)
BUN: 9 mg/dL (ref 6–24)
CHLORIDE: 105 mmol/L (ref 96–106)
CO2: 25 mmol/L (ref 20–29)
CREATININE: 0.85 mg/dL (ref 0.57–1.00)
Calcium: 9.5 mg/dL (ref 8.7–10.2)
GFR calc Af Amer: 94 mL/min/{1.73_m2} (ref >59)
GFR calc non Af Amer: 82 mL/min/{1.73_m2} (ref >59)
GLUCOSE: 104 mg/dL — AB (ref 65–99)
Globulin, Total: 2.2 g/dL (ref 1.5–4.5)
Potassium: 4.4 mmol/L (ref 3.5–5.2)
Sodium: 142 mmol/L (ref 134–144)
Total Protein: 6.5 g/dL (ref 6.0–8.5)

## 2018-05-25 LAB — VITAMIN D 25 HYDROXY (VIT D DEFICIENCY, FRACTURES): Vit D, 25-Hydroxy: 26.3 ng/mL — ABNORMAL LOW (ref 30.0–100.0)

## 2018-07-09 ENCOUNTER — Encounter: Payer: Self-pay | Admitting: Physician Assistant

## 2018-07-20 ENCOUNTER — Other Ambulatory Visit: Payer: Self-pay | Admitting: Physician Assistant

## 2018-07-20 ENCOUNTER — Other Ambulatory Visit: Payer: Self-pay | Admitting: Gastroenterology

## 2018-08-06 ENCOUNTER — Ambulatory Visit: Payer: BLUE CROSS/BLUE SHIELD | Admitting: Gastroenterology

## 2018-08-06 ENCOUNTER — Encounter: Payer: Self-pay | Admitting: Gastroenterology

## 2018-08-06 ENCOUNTER — Encounter

## 2018-08-06 VITALS — BP 112/82 | HR 76 | Ht 65.0 in | Wt 231.0 lb

## 2018-08-06 DIAGNOSIS — R1310 Dysphagia, unspecified: Secondary | ICD-10-CM | POA: Diagnosis not present

## 2018-08-06 DIAGNOSIS — R1319 Other dysphagia: Secondary | ICD-10-CM

## 2018-08-06 DIAGNOSIS — K219 Gastro-esophageal reflux disease without esophagitis: Secondary | ICD-10-CM | POA: Diagnosis not present

## 2018-08-06 MED ORDER — PANTOPRAZOLE SODIUM 40 MG PO TBEC: 40.0000 mg | DELAYED_RELEASE_TABLET | Freq: Every day | ORAL | 0 refills | Status: DC

## 2018-08-06 NOTE — Progress Notes (Signed)
    History of Present Illness: This is a 47 year old female with progressive solid food dysphagia.  She has a history of GERD with esophageal strictures.  EGD with dilation in May 2018 LA class B erosive esophagitis, and esophageal stricture and hiatal hernia.  Her dysphagia improved after dilation in 2018.  She states that she discontinued the use of omeprazole and then of ranitidine as she felt both medications cause memory difficulties and interfered with work.  She relates no ongoing heartburn symptoms and her solid food dysphagia has progressively worsened since April.  She is now unable to swallow anything but full liquids.   Current Medications, Allergies, Past Medical History, Past Surgical History, Family History and Social History were reviewed in [employer] record.  Physical Exam: General: Well developed, well nourished, no acute distress Head: Normocephalic and atraumatic Eyes:  sclerae anicteric, EOMI Ears: Normal auditory acuity Mouth: No deformity or lesions Lungs: Clear throughout to auscultation Heart: Regular rate and rhythm; no murmurs, rubs or bruits Abdomen: Soft, non tender and non distended. No masses, hepatosplenomegaly or hernias noted. Normal Bowel sounds Rectal: Not done Musculoskeletal: Symmetrical with no gross deformities  Pulses:  Normal pulses noted Extremities: No clubbing, cyanosis, edema or deformities noted Neurological: Alert oriented x 4, grossly nonfocal Psychological:  Alert and cooperative. Normal mood and affect  Assessment and Recommendations:  1. Dysphagia, recurrent esophageal stricture is strongly suspected.  Erosive esophagitis however no longer taking acid suppression therapy.  Remain on full liquid diet until EGD is completed.  She is advised to begin evaluation with her PCP regarding memory difficulties.  It would be unusual for 2 different classes of medications to interfer with memory but certainly possible.  Trial  of pantoprazole 40 mg daily.  Schedule EGD with dilation. The risks (including bleeding, perforation, infection, missed lesions, medication reactions and possible hospitalization or surgery if complications occur), benefits, and alternatives to endoscopy with possible biopsy and possible dilation were discussed with the patient and they consent to proceed.   2.  CRC screening, average risk.  Recommend colonoscopy at age 89.

## 2018-08-06 NOTE — Patient Instructions (Signed)
We have sent the following medications to your pharmacy for you to pick up at your convenience: pantoprazole.   You have been scheduled for an endoscopy. Please follow written instructions given to you at your visit today. If you use inhalers (even only as needed), please bring them with you on the day of your procedure. Your physician has requested that you go to www.startemmi.com and enter the access code given to you at your visit today. This web site gives a general overview about your procedure. However, you should still follow specific instructions given to you by our office regarding your preparation for the procedure.  Normal BMI (Body Mass Index- based on height and weight) is between 19 and 25. Your BMI today is Body mass index is 38.44 kg/m. Marland Kitchen Please consider follow up  regarding your BMI with your Primary Care Provider.  Thank you for choosing me and [facility].  Venita Lick. Pleas Koch., MD., Clementeen Graham

## 2018-08-16 ENCOUNTER — Encounter: Payer: BLUE CROSS/BLUE SHIELD | Admitting: Gastroenterology

## 2018-09-28 DIAGNOSIS — M1712 Unilateral primary osteoarthritis, left knee: Secondary | ICD-10-CM | POA: Insufficient documentation

## 2018-12-23 IMAGING — RF DG ESOPHAGUS
10 of 14 series · 12 of 24 positions shown · non-contrast
Comparison: EGD report 02/13/2017.  Chest CTA 09/07/2008.

CLINICAL DATA: 45-year-old female with dysphagia discovered to have
a mild benign distal esophageal stricture on EGD and dilated on
02/13/2017. Small hiatal hernia and a degree of reflux esophagitis
also noted at that time.

Now dysphagia symptoms are more proximal, sternal notch level.
EXAM:
ESOPHOGRAM / BARIUM SWALLOW / BARIUM TABLET STUDY
TECHNIQUE: Combined double contrast and single contrast examination performed
using effervescent crystals, thick barium liquid, and thin barium
liquid. The patient was observed with fluoroscopy swallowing a 13 mm
barium sulphate tablet.
FLUOROSCOPY TIME:  Fluoroscopy Time:  2 minutes 0 seconds
Radiation Exposure Index (if provided by the fluoroscopic device):
23.1 mGy
Number of Acquired Spot Images: 0

[Series 1: cp_standard · 0.51mm/px · 1 of 32 frames shown (1 of 10)]
[frame 28/32]
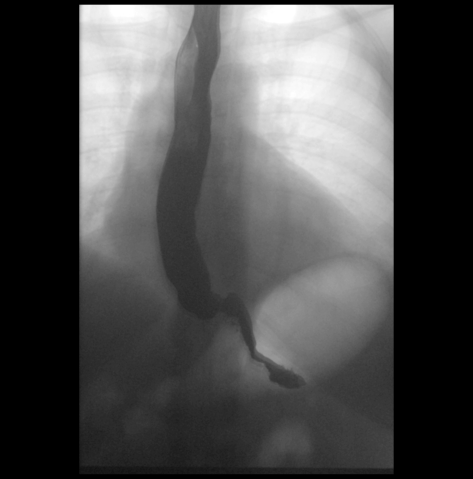

[Series 3: cp_standard · 0.34mm/px · 1 of 28 frames shown (2 of 10)]
[frame 7/28]
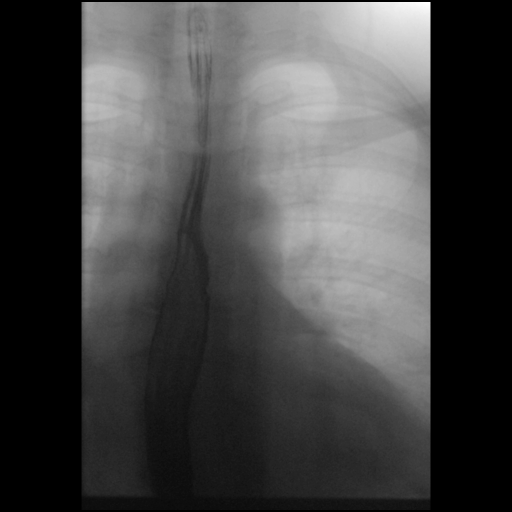

[Series 4: cp_standard · 0.34mm/px · 2 of 43 frames shown (3 of 10)]
[frame 5/43]
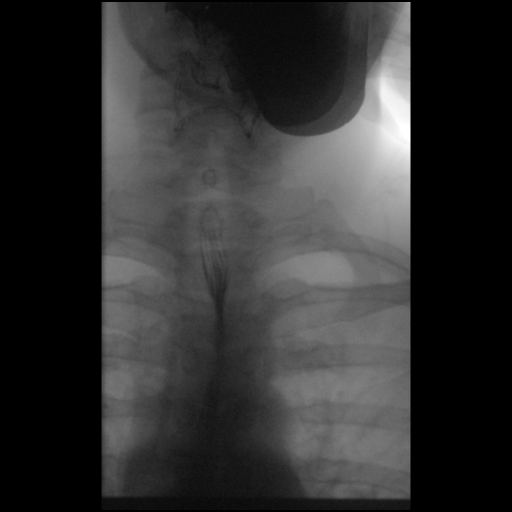
[frame 37/43]
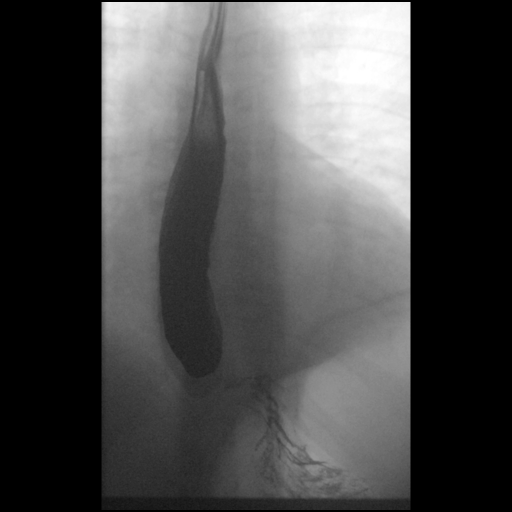

[Series 7: cp_standard · 0.17mm/px · 1 of 1 slices shown (4 of 10)]
[im 1/1]
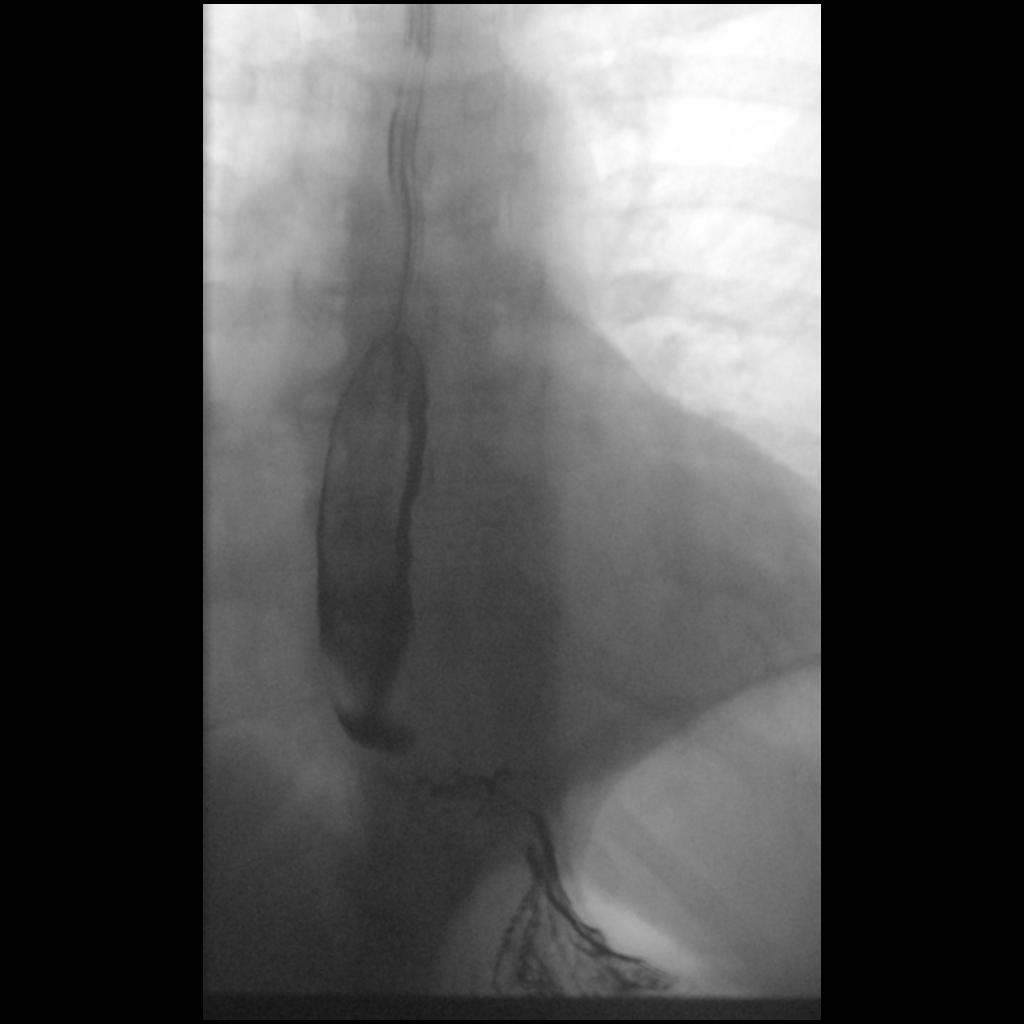

[Series 8: cp_standard · 0.51mm/px · 1 of 25 frames shown (5 of 10)]
[frame 22/25]
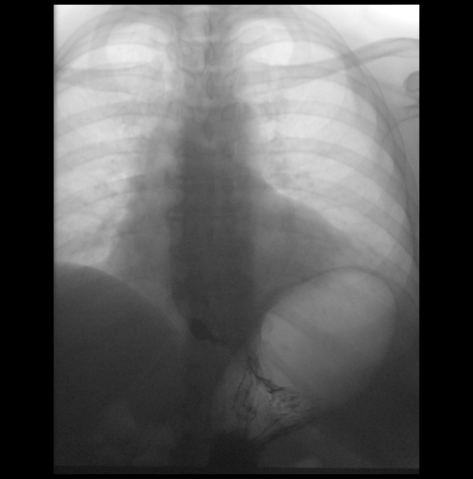

[Series 11: cp_standard · 0.34mm/px · 2 of 17 frames shown (6 of 10)]
[frame 3/17]
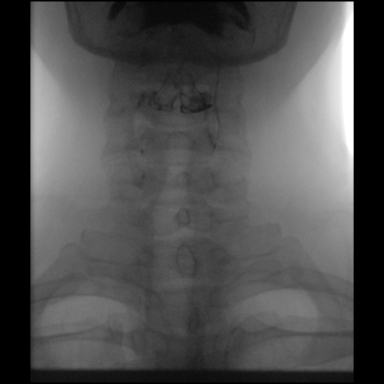
[frame 15/17]
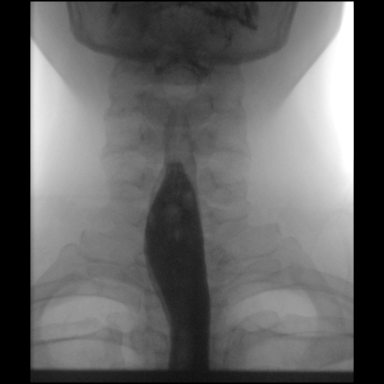

[Series 13: cp_standard · 0.34mm/px · 1 of 17 frames shown (7 of 10)]
[frame 5/17]
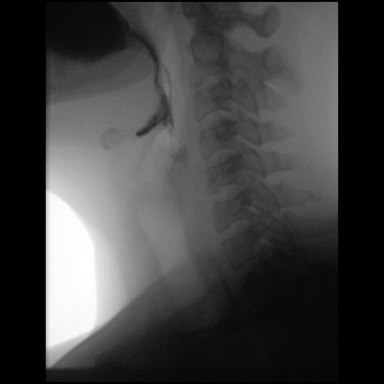

[Series 14: cp_standard · 0.51mm/px · 1 of 44 frames shown (8 of 10)]
[frame 23/44]
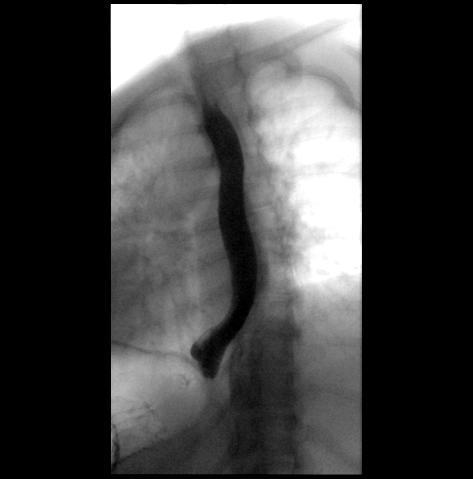

[Series 15: cp_standard · 0.17mm/px · 1 of 1 slices shown (9 of 10)]
[im 1/1]
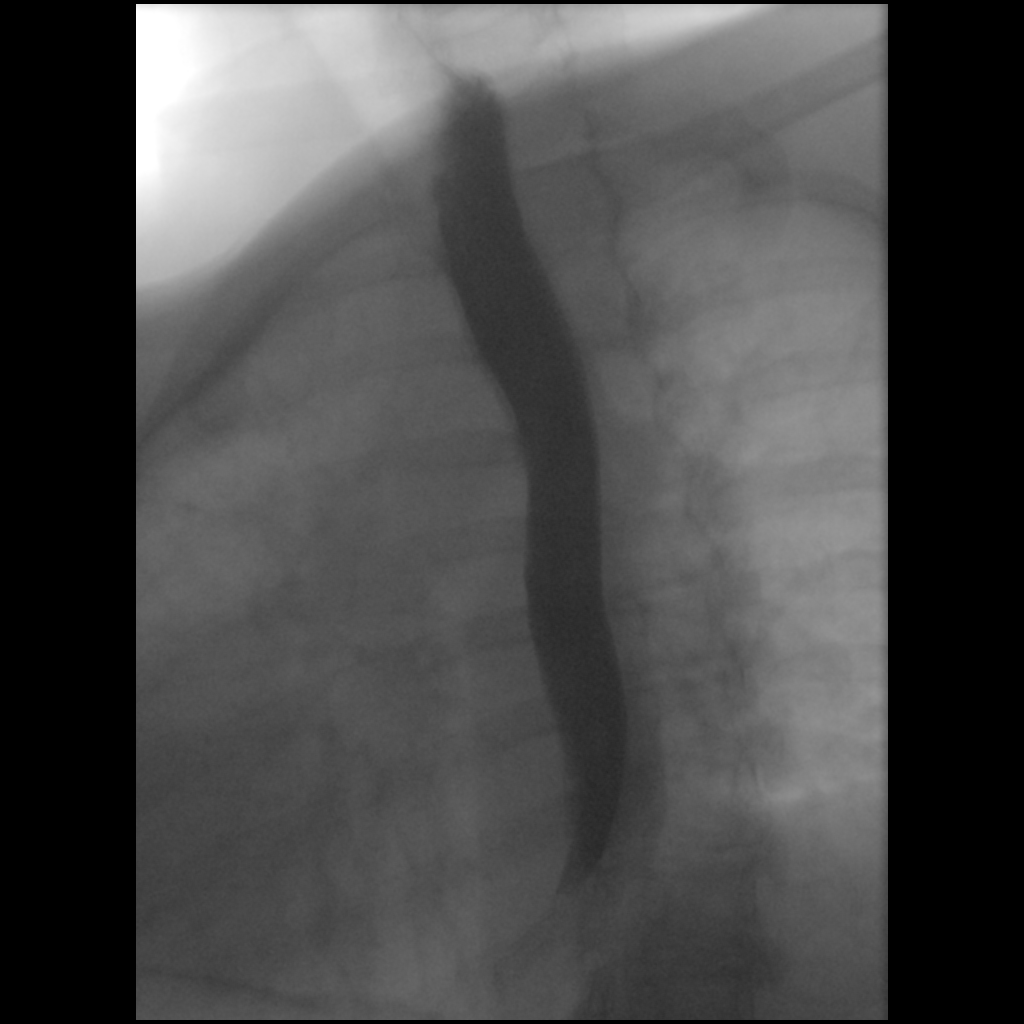

[Series 18: cp_standard · 0.27mm/px · 1 of 1 slices shown (10 of 10)]
[im 1/1]
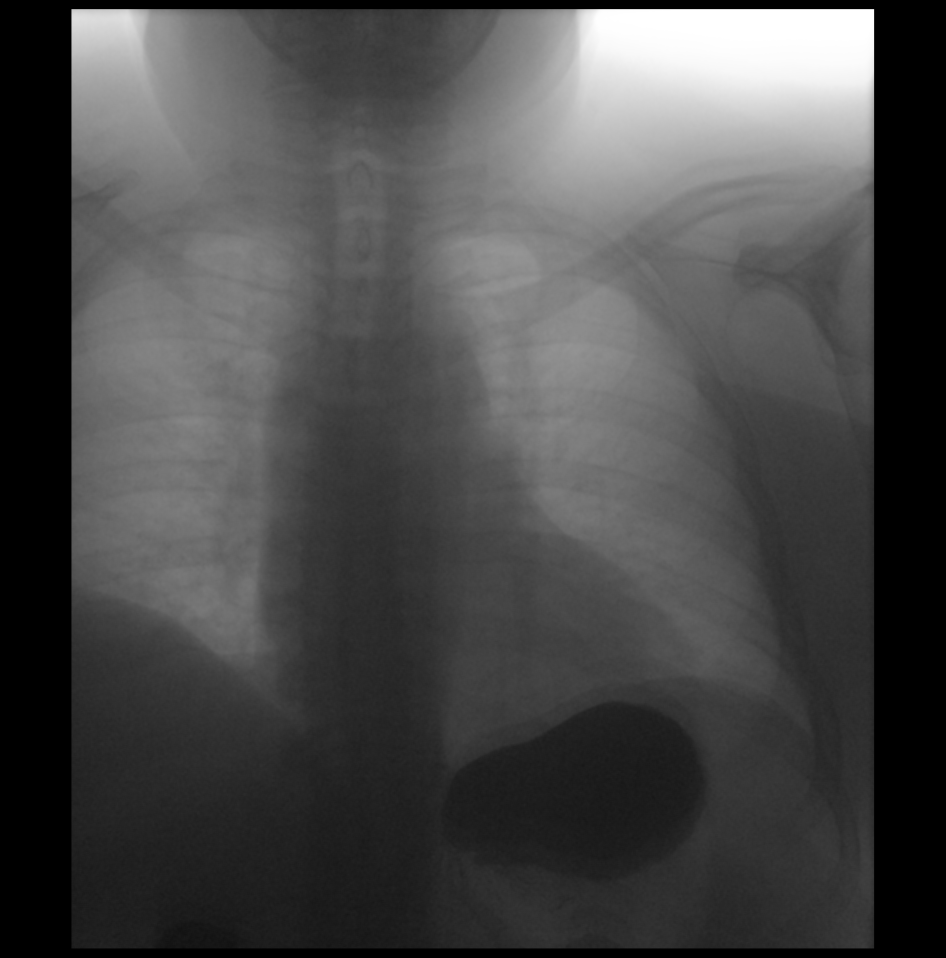

[12 of 24 positions shown; findings below may reference images not displayed]

FINDINGS: A double contrast study was undertaken and the patient tolerated
this well and without difficulty.

No obstruction to the forward flow of contrast throughout the
esophagus and into the stomach. Normal esophageal course and
contour. Normal esophageal mucosal pattern.

A small gastric hiatal hernia is present, and similar to that seen
on the 2662 CTA.

A prominent Schatzki ring is demonstrated, best seen on the prone
swallows (series 14, image 44). However, a 12.5 mm barium tablet was
administered and passed freely to the stomach without delay.

Dedicated imaging of the cervical esophagus is normal.

With prone swallows esophageal motility is mildly diminished for
age, with a proximal release of contrast (series 14).

No gastroesophageal reflux occurred spontaneously or was elicited.
Incidental prompt gastric emptying.
IMPRESSION: 1. Chronic small gastric hiatal hernia.
2. Schatzki ring, but this did not impede the passage of a 12.5 mm
barium tablet.
3. Otherwise normal esophagram.

## 2019-01-03 DIAGNOSIS — M79644 Pain in right finger(s): Secondary | ICD-10-CM | POA: Insufficient documentation

## 2019-01-06 DIAGNOSIS — M1811 Unilateral primary osteoarthritis of first carpometacarpal joint, right hand: Secondary | ICD-10-CM | POA: Insufficient documentation

## 2019-05-24 ENCOUNTER — Other Ambulatory Visit: Payer: Self-pay | Admitting: Physician Assistant

## 2019-05-30 ENCOUNTER — Ambulatory Visit: Payer: BC Managed Care – PPO | Admitting: Family Medicine

## 2019-05-30 ENCOUNTER — Other Ambulatory Visit: Payer: Self-pay

## 2019-05-30 ENCOUNTER — Ambulatory Visit (INDEPENDENT_AMBULATORY_CARE_PROVIDER_SITE_OTHER): Payer: BC Managed Care – PPO

## 2019-05-30 VITALS — BP 108/70 | HR 72 | Temp 98.8°F | Resp 14 | Wt 237.8 lb

## 2019-05-30 DIAGNOSIS — I10 Essential (primary) hypertension: Secondary | ICD-10-CM | POA: Diagnosis not present

## 2019-05-30 DIAGNOSIS — R34 Anuria and oliguria: Secondary | ICD-10-CM

## 2019-05-30 DIAGNOSIS — R0609 Other forms of dyspnea: Secondary | ICD-10-CM

## 2019-05-30 DIAGNOSIS — R609 Edema, unspecified: Secondary | ICD-10-CM

## 2019-05-30 LAB — POCT URINALYSIS DIP (MANUAL ENTRY)
Bilirubin, UA: NEGATIVE
Blood, UA: NEGATIVE
Glucose, UA: NEGATIVE mg/dL
Ketones, POC UA: NEGATIVE mg/dL
Leukocytes, UA: NEGATIVE
Nitrite, UA: NEGATIVE
Protein Ur, POC: NEGATIVE mg/dL
Spec Grav, UA: 1.025 (ref 1.010–1.025)
Urobilinogen, UA: 0.2 E.U./dL
pH, UA: 6.5 (ref 5.0–8.0)

## 2019-05-30 NOTE — Progress Notes (Signed)
Subjective:    Patient ID: Hannah Buchanan, female    DOB: 16-Oct-1970, 48 y.o.   MRN: [id number]  HPI Hannah Buchanan is a 48 y.o. female Presents today for: Chief Complaint  Patient presents with  . Edema    Both ankles but feels like swollen everywhere  . decrease urine output    only a little when I do go   Edema:  Has noticed decreased urine output past few weeks. No dysuria, no blood in urine, no foamy urine. No new meds, no diuretic.  Has been drinking fluids - same amount.  Feels more swollen in legs, sometime in hands.  Some dyspnea on exertion past few weeks. No chest pain/tightness.  No cough, no fever.  Swelling improved overnight - worsens during day.  Saw cardiology in 2014 - swelling then.   Has increased weight with less activity during furlough with pandemic. Back at work past few weeks. Seated work - check in at [employer].  Rattle in R lung in am only. Past 6 months?  Nonsmoker.   History of hypertension, on metoprolol.  No known history of CHF.  Echo in February 2014 with EF 65%.  Weight has increased 6 pounds from October of last year. On meloxicam long term - 7.5-15mg  daily, rare holiday off med for about 2 weeks - last done in June.    Wt Readings from Last 3 Encounters:  05/30/19 237 lb 12.8 oz (107.9 kg)  08/06/18 231 lb (104.8 kg)  05/24/18 230 lb 12.8 oz (104.7 kg)   Lab Results  Component Value Date   CREATININE 0.85 05/24/2018      Patient Active Problem List   Diagnosis Date Noted  . Allergic rhinitis due to pollen 11/18/2014  . Essential hypertension, benign 11/18/2014  . GERD 11/08/2009  . OTHER DYSPHAGIA 11/08/2009  . FLATULENCE ERUCTATION AND GAS PAIN 11/08/2009   Past Medical History:  Diagnosis Date  . Allergy    Claritin daily yearround.  . Esophageal stricture   . GERD (gastroesophageal reflux disease)   . Hiatal hernia   . Hypertension   . Pneumonia   . Recurrent cold sores   . Status post dilation of esophageal  narrowing    Past Surgical History:  Procedure Laterality Date  . Admission  10/09/1998   prolonged syncopal event; admission for 24 hours.  [place]  . BLADDER REPAIR  1998   [facility]/Gynecology.  [facility] Kitchen BREAST SURGERY  1999   breast reduction   Allergies  Allergen Reactions  . Wheat Bran    Prior to Admission medications   Medication Sig Start Date End Date Taking? Authorizing Provider  docusate sodium (STOOL SOFTENER) 100 MG capsule Take 100 mg by mouth daily.   Yes [provider]  loratadine (CLARITIN) 10 MG tablet Take 10 mg by mouth daily.   Yes [provider]  meloxicam (MOBIC) 15 MG tablet TK 1 T PO QD 05/08/18  Yes [provider]  metoprolol succinate (TOPROL-XL) 25 MG 24 hr tablet TAKE 1 AND 1/2 TABLETS(37.5 MG) BY MOUTH DAILY Patient needs office visit for more refills 05/24/18  Yes Barnett Abu, [place] D, PA-C  valACYclovir (VALTREX) 500 MG tablet Take 500 mg by mouth as needed.   Yes [provider]  albuterol (PROVENTIL HFA;VENTOLIN HFA) 108 (90 BASE) MCG/ACT inhaler Inhale 2 puffs into the lungs every 6 (six) hours as needed for wheezing. 12/12/13 12/31/15  Porfirio Oar, PA  nitroGLYCERIN (NITROSTAT) 0.4 MG SL tablet Place 0.4  mg under the tongue every 5 (five) minutes as needed for chest pain.    [provider]   Social History   Socioeconomic History  . Marital status: Married    Spouse name: Not on file  . Number of children: 2  . Years of education: Not on file  . Highest education level: Not on file  Occupational History  . Occupation: Engineer, sitemedical assistant    Employer: [employer]  Social Needs  . Financial resource strain: Not on file  . Food insecurity    Worry: Not on file    Inability: Not on file  . Transportation needs    Medical: Not on file    Non-medical: Not on file  Tobacco Use  . Smoking status: Never Smoker  . Smokeless tobacco: Never Used  Substance and Sexual Activity  . Alcohol use:  Yes    Comment: occasional  . Drug use: No  . Sexual activity: Yes  Lifestyle  . Physical activity    Days per week: Not on file    Minutes per session: Not on file  . Stress: Not on file  Relationships  . Social Musicianconnections    Talks on phone: Not on file    Gets together: Not on file    Attends religious service: Not on file    Active member of club or organization: Not on file    Attends meetings of clubs or organizations: Not on file    Relationship status: Not on file  . Intimate partner violence    Fear of current or ex partner: Not on file    Emotionally abused: Not on file    Physically abused: Not on file    Forced sexual activity: Not on file  Other Topics Concern  . Not on file  Social History Narrative   Marital status: married x 26 years; happily      Chldren: 2 children (22, 625); no grandchldren.      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    Review of Systems     Objective:   Physical Exam Vitals signs reviewed.  Constitutional:      General: She is not in acute distress.    Appearance: She is well-developed. She is obese. She is not ill-appearing or diaphoretic.  HENT:     Head: Normocephalic and atraumatic.  Eyes:     Conjunctiva/sclera: Conjunctivae normal.     Pupils: Pupils are equal, round, and reactive to light.  Neck:     Vascular: No carotid bruit.  Cardiovascular:     Rate and Rhythm: Normal rate and regular rhythm.     Heart sounds: Normal heart sounds.     Comments: Distant heart sounds, but sounded regular.  Pulmonary:     Effort: Pulmonary effort is normal.     Breath sounds: Normal breath sounds. No rales.     Comments: Distant, but no rales heard.  Abdominal:     Palpations: Abdomen is soft. There is no pulsatile mass.      Tenderness: There is no abdominal tenderness.  Musculoskeletal:     Right lower leg: Edema (1+ LE bilaterally without appreciable pitting. ) present.  Left lower leg: Edema present.  Skin:    General: Skin is warm and dry.  Neurological:     Mental Status: She is alert and oriented to person, place, and time.  Psychiatric:        Behavior: Behavior normal.      Results for orders placed or performed in visit on 05/30/19  POCT urinalysis dipstick  Result Value Ref Range   Color, UA yellow yellow   Clarity, UA cloudy (A) clear   Glucose, UA negative negative mg/dL   Bilirubin, UA negative negative   Ketones, POC UA negative negative mg/dL   Spec Grav, UA 1.025 1.010 - 1.025   Blood, UA negative negative   pH, UA 6.5 5.0 - 8.0   Protein Ur, POC negative negative mg/dL   Urobilinogen, UA 0.2 0.2 or 1.0 E.U./dL   Nitrite, UA Negative Negative   Leukocytes, UA Negative Negative   Dg Chest 2 View  Result Date: 05/30/2019 CLINICAL DATA:  Shortness of breath with exertion EXAM: CHEST - 2 VIEW COMPARISON:  11/12/2013 FINDINGS: The heart size and mediastinal contours are within normal limits. Both lungs are clear. The visualized skeletal structures are unremarkable. IMPRESSION: No active cardiopulmonary disease. Electronically Signed   By: Rolm Baptise M.D.   On: 05/30/2019 10:49   EKG: Sinus rhythm, rate 63, no acute findings.     Assessment & Plan:   Hannah Buchanan is a 48 y.o. female Urine output low - Plan: POCT urinalysis dipstick  DOE (dyspnea on exertion) - Plan: CBC, TSH, Pro b natriuretic peptide, DG Chest 2 View, EKG 12-Lead  Edema, unspecified type - Plan: CBC, TSH, Comprehensive metabolic panel, Pro b natriuretic peptide  Essential hypertension - Plan: TSH, Comprehensive metabolic panel, DG Chest 2 View, EKG 12-Lead  Urinalysis reassuring, specifically no proteinuria.  Chest x-ray reassuring without pulmonary edema, no acute findings on EKG.   - Possible peripheral  edema but will check other tests for CHF including BNP.    -Potential risk of long-term use of meloxicam was discussed, so may need to review other options with her orthopedic group.  -Increase water intake, watch salt intake, other information provided on peripheral edema, then consider cardiology eval next 2 weeks if not improving.  ER/RTC precautions if worsening sooner.  No orders of the defined types were placed in this encounter.  Patient Instructions     Urine test was reassuring, specifically I do not see any protein.  Chest x-ray and EKG also looked okay.  Try to increase fluids throughout the day to see if that can help with the urine output, water is best.  I will check some other tests for the swelling, but see information below on peripheral edema.  Watch for salt intake in the diet as that can worsen those symptoms.  Depending on blood work and improvement of symptoms over the next week or 2, can refer you to cardiology if needed for other testing.  Please follow-up if any acute worsening symptoms, otherwise can recheck in 2 weeks.     Peripheral Edema  Peripheral edema is swelling that is caused by a buildup of fluid. Peripheral edema most often affects the lower legs, ankles, and feet. It can also develop in the arms, hands, and face. The area of the body that has peripheral edema will look swollen. It may also feel heavy or warm. Your clothes may start to feel tight. Pressing on the area may make a temporary dent in your skin. You  may not be able to move your swollen arm or leg as much as usual. There are many causes of peripheral edema. It can happen because of a complication of other conditions such as congestive heart failure, kidney disease, or a problem with your blood circulation. It also can be a side effect of certain medicines or because of an infection. It often happens to women during pregnancy. Sometimes, the cause is not known. Follow these instructions at home:  Managing pain, stiffness, and swelling   Raise (elevate) your legs while you are sitting or lying down.  Move around often to prevent stiffness and to lessen swelling.  Do not sit or stand for long periods of time.  Wear support stockings as told by your health care provider. Medicines  Take over-the-counter and prescription medicines only as told by your health care provider.  Your health care provider may prescribe medicine to help your body get rid of excess water (diuretic). General instructions  Pay attention to any changes in your symptoms.  Follow instructions from your health care provider about limiting salt (sodium) in your diet. Sometimes, eating less salt may reduce swelling.  Moisturize skin daily to help prevent skin from cracking and draining.  Keep all follow-up visits as told by your health care provider. This is important. Contact a health care provider if you have:  A fever.  Edema that starts suddenly or is getting worse, especially if you are pregnant or have a medical condition.  Swelling in only one leg.  Increased swelling, redness, or pain in one or both of your legs.  Drainage or sores at the area where you have edema. Get help right away if you:  Develop shortness of breath, especially when you are lying down.  Have pain in your chest or abdomen.  Feel weak.  Feel faint. Summary  Peripheral edema is swelling that is caused by a buildup of fluid. Peripheral edema most often affects the lower legs, ankles, and feet.  Move around often to prevent stiffness and to lessen swelling. Do not sit or stand for long periods of time.  Pay attention to any changes in your symptoms.  Contact a health care provider if you have edema that starts suddenly or is getting worse, especially if you are pregnant or have a medical condition.  Get help right away if you develop shortness of breath, especially when lying down. This information is not intended  to replace advice given to you by your health care provider. Make sure you discuss any questions you have with your health care provider. Document Released: 11/02/2004 Document Revised: 06/19/2018 Document Reviewed: 06/19/2018 Elsevier Patient Education  [employer]2020 [employer]  If you have lab work done today you will be contacted with your lab results within the next 2 weeks.  If you have not heard from us then please contact us. The fastest way to get your results is to register for My Chart.   IF you received an x-ray today, you will receive an invoice from [facility]. Please contact [facility] at [phone] with questions or concerns regarding your invoice.   IF you received labwork today, you will receive an invoice from [facility]. Please contact [facility] at [phone] with questions or concerns regarding your invoice.   Our billing staff will not be able to assist you with questions regarding bills from these companies.  You will be contacted with the lab results as soon as they are available. The fastest way to get your results  is to activate your My Chart account. Instructions are located on the last page of this paperwork. If you have not heard from us regarding the results in 2 weeks, please contact this office.       Signed,   Meredith StaggersJeffrey Isaic Syler, MD [facility].  06/01/19 11:14 AM

## 2019-05-30 NOTE — Patient Instructions (Addendum)
Urine test was reassuring, specifically I do not see any protein.  Chest x-ray and EKG also looked okay.  Try to increase fluids throughout the day to see if that can help with the urine output, water is best.  I will check some other tests for the swelling, but see information below on peripheral edema.  Watch for salt intake in the diet as that can worsen those symptoms.  Depending on blood work and improvement of symptoms over the next week or 2, can refer you to cardiology if needed for other testing.  Please follow-up if any acute worsening symptoms, otherwise can recheck in 2 weeks.     Peripheral Edema  Peripheral edema is swelling that is caused by a buildup of fluid. Peripheral edema most often affects the lower legs, ankles, and feet. It can also develop in the arms, hands, and face. The area of the body that has peripheral edema will look swollen. It may also feel heavy or warm. Your clothes may start to feel tight. Pressing on the area may make a temporary dent in your skin. You may not be able to move your swollen arm or leg as much as usual. There are many causes of peripheral edema. It can happen because of a complication of other conditions such as congestive heart failure, kidney disease, or a problem with your blood circulation. It also can be a side effect of certain medicines or because of an infection. It often happens to women during pregnancy. Sometimes, the cause is not known. Follow these instructions at home: Managing pain, stiffness, and swelling   Raise (elevate) your legs while you are sitting or lying down.  Move around often to prevent stiffness and to lessen swelling.  Do not sit or stand for long periods of time.  Wear support stockings as told by your health care provider. Medicines  Take over-the-counter and prescription medicines only as told by your health care provider.  Your health care provider may prescribe medicine to help your body get rid of  excess water (diuretic). General instructions  Pay attention to any changes in your symptoms.  Follow instructions from your health care provider about limiting salt (sodium) in your diet. Sometimes, eating less salt may reduce swelling.  Moisturize skin daily to help prevent skin from cracking and draining.  Keep all follow-up visits as told by your health care provider. This is important. Contact a health care provider if you have:  A fever.  Edema that starts suddenly or is getting worse, especially if you are pregnant or have a medical condition.  Swelling in only one leg.  Increased swelling, redness, or pain in one or both of your legs.  Drainage or sores at the area where you have edema. Get help right away if you:  Develop shortness of breath, especially when you are lying down.  Have pain in your chest or abdomen.  Feel weak.  Feel faint. Summary  Peripheral edema is swelling that is caused by a buildup of fluid. Peripheral edema most often affects the lower legs, ankles, and feet.  Move around often to prevent stiffness and to lessen swelling. Do not sit or stand for long periods of time.  Pay attention to any changes in your symptoms.  Contact a health care provider if you have edema that starts suddenly or is getting worse, especially if you are pregnant or have a medical condition.  Get help right away if you develop shortness of breath, especially when  lying down. This information is not intended to replace advice given to you by your health care provider. Make sure you discuss any questions you have with your health care provider. Document Released: 11/02/2004 Document Revised: 06/19/2018 Document Reviewed: 06/19/2018 Elsevier Patient Education  [employer]2020 [employer]  If you have lab work done today you will be contacted with your lab results within the next 2 weeks.  If you have not heard from us then please contact us. The fastest way to get your results is  to register for My Chart.   IF you received an x-ray today, you will receive an invoice from [facility]. Please contact [facility] at [phone] with questions or concerns regarding your invoice.   IF you received labwork today, you will receive an invoice from [facility]. Please contact [facility] at [phone] with questions or concerns regarding your invoice.   Our billing staff will not be able to assist you with questions regarding bills from these companies.  You will be contacted with the lab results as soon as they are available. The fastest way to get your results is to activate your My Chart account. Instructions are located on the last page of this paperwork. If you have not heard from us regarding the results in 2 weeks, please contact this office.

## 2019-05-31 LAB — CBC
Hematocrit: 41.2 % (ref 34.0–46.6)
Hemoglobin: 13.6 g/dL (ref 11.1–15.9)
MCH: 30.4 pg (ref 26.6–33.0)
MCHC: 33 g/dL (ref 31.5–35.7)
MCV: 92 fL (ref 79–97)
Platelets: 169 10*3/uL (ref 150–450)
RBC: 4.47 x10E6/uL (ref 3.77–5.28)
RDW: 12.7 % (ref 11.7–15.4)
WBC: 5.8 10*3/uL (ref 3.4–10.8)

## 2019-05-31 LAB — COMPREHENSIVE METABOLIC PANEL
ALT: 42 IU/L — ABNORMAL HIGH (ref 0–32)
AST: 24 IU/L (ref 0–40)
Albumin/Globulin Ratio: 2 (ref 1.2–2.2)
Albumin: 4.5 g/dL (ref 3.8–4.8)
Alkaline Phosphatase: 84 IU/L (ref 39–117)
BUN/Creatinine Ratio: 24 — ABNORMAL HIGH (ref 9–23)
BUN: 19 mg/dL (ref 6–24)
Bilirubin Total: 0.4 mg/dL (ref 0.0–1.2)
CO2: 24 mmol/L (ref 20–29)
Calcium: 9.3 mg/dL (ref 8.7–10.2)
Chloride: 101 mmol/L (ref 96–106)
Creatinine, Ser: 0.79 mg/dL (ref 0.57–1.00)
GFR calc Af Amer: 102 mL/min/{1.73_m2} (ref >59)
GFR calc non Af Amer: 88 mL/min/{1.73_m2} (ref >59)
Globulin, Total: 2.2 g/dL (ref 1.5–4.5)
Glucose: 105 mg/dL — ABNORMAL HIGH (ref 65–99)
Potassium: 4.7 mmol/L (ref 3.5–5.2)
Sodium: 138 mmol/L (ref 134–144)
Total Protein: 6.7 g/dL (ref 6.0–8.5)

## 2019-05-31 LAB — PRO B NATRIURETIC PEPTIDE: NT-Pro BNP: 45 pg/mL (ref 0–249)

## 2019-05-31 LAB — TSH: TSH: 1.88 u[IU]/mL (ref 0.450–4.500)

## 2019-06-01 ENCOUNTER — Encounter: Payer: Self-pay | Admitting: Family Medicine

## 2019-06-12 ENCOUNTER — Encounter: Payer: Self-pay | Admitting: Family Medicine

## 2019-06-12 ENCOUNTER — Ambulatory Visit: Payer: BC Managed Care – PPO | Admitting: Family Medicine

## 2019-06-12 ENCOUNTER — Other Ambulatory Visit: Payer: Self-pay

## 2019-06-12 VITALS — BP 113/75 | HR 79 | Temp 98.7°F | Resp 14 | Wt 235.0 lb

## 2019-06-12 DIAGNOSIS — R0609 Other forms of dyspnea: Secondary | ICD-10-CM | POA: Diagnosis not present

## 2019-06-12 DIAGNOSIS — R51 Headache: Secondary | ICD-10-CM

## 2019-06-12 DIAGNOSIS — R519 Headache, unspecified: Secondary | ICD-10-CM

## 2019-06-12 DIAGNOSIS — R609 Edema, unspecified: Secondary | ICD-10-CM | POA: Diagnosis not present

## 2019-06-12 NOTE — Patient Instructions (Addendum)
Try compression stockings, feet elevation during the day as tolerated for swelling.  See other condition below.  Make sure to stay hydrated throughout the day.  If headache is not continue to improve, follow-up to discuss further.  I will refer you to cardiology to discuss the swelling and shortness of breath on exertion but previous testing looked okay.  Return to the clinic or go to the nearest emergency room if any of your symptoms worsen or new symptoms occur.   Peripheral Edema  Peripheral edema is swelling that is caused by a buildup of fluid. Peripheral edema most often affects the lower legs, ankles, and feet. It can also develop in the arms, hands, and face. The area of the body that has peripheral edema will look swollen. It may also feel heavy or warm. Your clothes may start to feel tight. Pressing on the area may make a temporary dent in your skin. You may not be able to move your swollen arm or leg as much as usual. There are many causes of peripheral edema. It can happen because of a complication of other conditions such as congestive heart failure, kidney disease, or a problem with your blood circulation. It also can be a side effect of certain medicines or because of an infection. It often happens to women during pregnancy. Sometimes, the cause is not known. Follow these instructions at home: Managing pain, stiffness, and swelling   Raise (elevate) your legs while you are sitting or lying down.  Move around often to prevent stiffness and to lessen swelling.  Do not sit or stand for long periods of time.  Wear support stockings as told by your health care provider. Medicines  Take over-the-counter and prescription medicines only as told by your health care provider.  Your health care provider may prescribe medicine to help your body get rid of excess water (diuretic). General instructions  Pay attention to any changes in your symptoms.  Follow instructions from your  health care provider about limiting salt (sodium) in your diet. Sometimes, eating less salt may reduce swelling.  Moisturize skin daily to help prevent skin from cracking and draining.  Keep all follow-up visits as told by your health care provider. This is important. Contact a health care provider if you have:  A fever.  Edema that starts suddenly or is getting worse, especially if you are pregnant or have a medical condition.  Swelling in only one leg.  Increased swelling, redness, or pain in one or both of your legs.  Drainage or sores at the area where you have edema. Get help right away if you:  Develop shortness of breath, especially when you are lying down.  Have pain in your chest or abdomen.  Feel weak.  Feel faint. Summary  Peripheral edema is swelling that is caused by a buildup of fluid. Peripheral edema most often affects the lower legs, ankles, and feet.  Move around often to prevent stiffness and to lessen swelling. Do not sit or stand for long periods of time.  Pay attention to any changes in your symptoms.  Contact a health care provider if you have edema that starts suddenly or is getting worse, especially if you are pregnant or have a medical condition.  Get help right away if you develop shortness of breath, especially when lying down. This information is not intended to replace advice given to you by your health care provider. Make sure you discuss any questions you have with your health care  provider. Document Released: 11/02/2004 Document Revised: 06/19/2018 Document Reviewed: 06/19/2018 Elsevier Patient Education  2020 Elsevier Inc.  General Headache Without Cause A headache is pain or discomfort that is felt around the head or neck area. There are many causes and types of headaches. In some cases, the cause may not be found. Follow these instructions at home: Watch your condition for any changes. Let your doctor know about them. Take these steps to  help with your condition: Managing pain      Take over-the-counter and prescription medicines only as told by your doctor.  Lie down in a dark, quiet room when you have a headache.  If told, put ice on your head and neck area: ? Put ice in a plastic bag. ? Place a towel between your skin and the bag. ? Leave the ice on for 20 minutes, 2-3 times per day.  If told, put heat on the affected area. Use the heat source that your doctor recommends, such as a moist heat pack or a heating pad. ? Place a towel between your skin and the heat source. ? Leave the heat on for 20-30 minutes. ? Remove the heat if your skin turns bright red. This is very important if you are unable to feel pain, heat, or cold. You may have a greater risk of getting burned.  Keep lights dim if bright lights bother you or make your headaches worse. Eating and drinking  Eat meals on a regular schedule.  If you drink alcohol: ? Limit how much you use to:  0-1 drink a day for women.  0-2 drinks a day for men. ? Be aware of how much alcohol is in your drink. In the [place] one drink equals one 12 oz bottle of beer (355 mL), one 5 oz glass of wine (148 mL), or one 1 oz glass of hard liquor (44 mL).  Stop drinking caffeine, or reduce how much caffeine you drink. General instructions   Keep a journal to find out if certain things bring on headaches. For example, write down: ? What you eat and drink. ? How much sleep you get. ? Any change to your diet or medicines.  Get a massage or try other ways to relax.  Limit stress.  Sit up straight. Do not tighten (tense) your muscles.  Do not use any products that contain nicotine or tobacco. This includes cigarettes, e-cigarettes, and chewing tobacco. If you need help quitting, ask your doctor.  Exercise regularly as told by your doctor.  Get enough sleep. This often means 7-9 hours of sleep each night.  Keep all follow-up visits as told by your doctor. This is  important. Contact a doctor if:  Your symptoms are not helped by medicine.  You have a headache that feels different than the other headaches.  You feel sick to your stomach (nauseous) or you throw up (vomit).  You have a fever. Get help right away if:  Your headache gets very bad quickly.  Your headache gets worse after a lot of physical activity.  You keep throwing up.  You have a stiff neck.  You have trouble seeing.  You have trouble speaking.  You have pain in the eye or ear.  Your muscles are weak or you lose muscle control.  You lose your balance or have trouble walking.  You feel like you will pass out (faint) or you pass out.  You are mixed up (confused).  You have a seizure. Summary  A headache  is pain or discomfort that is felt around the head or neck area.  There are many causes and types of headaches. In some cases, the cause may not be found.  Keep a journal to help find out what causes your headaches. Watch your condition for any changes. Let your doctor know about them.  Contact a doctor if you have a headache that is different from usual, or if your headache is not helped by medicine.  Get help right away if your headache gets very bad, you throw up, you have trouble seeing, you lose your balance, or you have a seizure. This information is not intended to replace advice given to you by your health care provider. Make sure you discuss any questions you have with your health care provider. Document Released: 07/04/2008 Document Revised: 04/15/2018 Document Reviewed: 04/15/2018 Elsevier Patient Education  [employer]2020 [employer].   If you have lab work done today you will be contacted with your lab results within the next 2 weeks.  If you have not heard from us then please contact us. The fastest way to get your results is to register for My Chart.   IF you received an x-ray today, you will receive an invoice from [facility]. Please contact  [facility] at [phone] with questions or concerns regarding your invoice.   IF you received labwork today, you will receive an invoice from [facility]. Please contact [facility] at [phone] with questions or concerns regarding your invoice.   Our billing staff will not be able to assist you with questions regarding bills from these companies.  You will be contacted with the lab results as soon as they are available. The fastest way to get your results is to activate your My Chart account. Instructions are located on the last page of this paperwork. If you have not heard from us regarding the results in 2 weeks, please contact this office.

## 2019-06-12 NOTE — Progress Notes (Signed)
Subjective:    Patient ID: Hannah Buchanan, female    DOB: 10/31/1970, 48 y.o.   MRN: [id number]  HPI Hannah Buchanan is a 48 y.o. female Presents today for: Chief Complaint  Patient presents with  . Leg Swelling     2 wk f/u on edema of the lower extremadedies. Patient states swelling is still about the same as last time. No better   Follow-up from August 21 visit on exertion, peripheral edema.  Chest x-ray without signs of CHF, no proteinuria on urinalysis, mild hyperglycemia of 105 on CMP, ALT mildly elevated at 42, other LFTs looked okay.  CBC normal.  TSH normal, EKG sinus rhythm without apparent acute findings. Recommended she increase water intake, watch salt intake.  Chronic use of meloxicam concerns were discussed last visit, recommended she discuss with her orthopedic specialist.  Swelling about the same. Headache past few days. Drank pedialyte and then ASA this am - 80% gone.  No change in taste/smell. No fever, sore throat, cough, body aches, no known exposure to Covid 19.  Trying to drink more water, some improvement in urination. Less salt.  Has not discussed mobic with ortho. Tried stopping mobic - some soreness in joints.  No dyspnea at rest, but some persistent DOE.  No chest pain.  Seen by cardiology years ago - swelling then as well and treated with chlorthalidone at that time.  No compression stockings. Improves overnight - more swollen overnight.        Patient Active Problem List   Diagnosis Date Noted  . Allergic rhinitis due to pollen 11/18/2014  . Essential hypertension, benign 11/18/2014  . GERD 11/08/2009  . OTHER DYSPHAGIA 11/08/2009  . FLATULENCE ERUCTATION AND GAS PAIN 11/08/2009   Past Medical History:  Diagnosis Date  . Allergy    Claritin daily yearround.  . Esophageal stricture   . GERD (gastroesophageal reflux disease)   . Hiatal hernia   . Hypertension   . Pneumonia   . Recurrent cold sores   . Status post dilation of esophageal  narrowing    Past Surgical History:  Procedure Laterality Date  . Admission  10/09/1998   prolonged syncopal event; admission for 24 hours.  [address]  . BLADDER REPAIR  1998   [facility] Lowe/Gynecology.  [facility] Kitchen BREAST SURGERY  1999   breast reduction   Allergies  Allergen Reactions  . Wheat Bran    Prior to Admission medications   Medication Sig Start Date End Date Taking? Authorizing Provider  albuterol (PROVENTIL HFA;VENTOLIN HFA) 108 (90 BASE) MCG/ACT inhaler Inhale 2 puffs into the lungs every 6 (six) hours as needed for wheezing. 12/12/13 12/31/15  Porfirio Oar, PA  docusate sodium (STOOL SOFTENER) 100 MG capsule Take 100 mg by mouth daily.    [provider]  loratadine (CLARITIN) 10 MG tablet Take 10 mg by mouth daily.    [provider]  meloxicam (MOBIC) 15 MG tablet TK 1 T PO QD 05/08/18   [provider]  metoprolol succinate (TOPROL-XL) 25 MG 24 hr tablet TAKE 1 AND 1/2 TABLETS(37.5 MG) BY MOUTH DAILY Patient needs office visit for more refills 05/24/18   Benjiman Core D, PA-C  nitroGLYCERIN (NITROSTAT) 0.4 MG SL tablet Place 0.4 mg under the tongue every 5 (five) minutes as needed for chest pain.    [provider]  valACYclovir (VALTREX) 500 MG tablet Take 500 mg by mouth as needed.    [provider]   Social History  Socioeconomic History  . Marital status: Married    Spouse name: Not on file  . Number of children: 2  . Years of education: Not on file  . Highest education level: Not on file  Occupational History  . Occupation: Engineer, sitemedical assistant    Employer: [employer]  Social Needs  . Financial resource strain: Not on file  . Food insecurity    Worry: Not on file    Inability: Not on file  . Transportation needs    Medical: Not on file    Non-medical: Not on file  Tobacco Use  . Smoking status: Never Smoker  . Smokeless tobacco: Never Used  Substance and Sexual Activity  . Alcohol use: Yes    Comment:  occasional  . Drug use: No  . Sexual activity: Yes  Lifestyle  . Physical activity    Days per week: Not on file    Minutes per session: Not on file  . Stress: Not on file  Relationships  . Social Musicianconnections    Talks on phone: Not on file    Gets together: Not on file    Attends religious service: Not on file    Active member of club or organization: Not on file    Attends meetings of clubs or organizations: Not on file    Relationship status: Not on file  . Intimate partner violence    Fear of current or ex partner: Not on file    Emotionally abused: Not on file    Physically abused: Not on file    Forced sexual activity: Not on file  Other Topics Concern  . Not on file  Social History Narrative   Marital status: married x 26 years; happily      Chldren: 2 children (22, 5025); no grandchldren.      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    Review of Systems Per HPi.     Objective:   Physical Exam Vitals signs reviewed.  Constitutional:      Appearance: She is well-developed.  HENT:     Head: Normocephalic and atraumatic.  Eyes:     Conjunctiva/sclera: Conjunctivae normal.     Pupils: Pupils are equal, round, and reactive to light.  Neck:     Vascular: No carotid bruit.  Cardiovascular:     Rate and Rhythm: Normal rate and regular rhythm.     Heart sounds: Normal heart sounds.     Comments: Distant heart sounds, but no apparent murmur.  Pulmonary:     Effort: Pulmonary effort is normal.     Breath sounds: Normal breath sounds.  Abdominal:     Palpations: Abdomen is soft. There is no pulsatile mass.     Tenderness: There is no abdominal tenderness.  Musculoskeletal:     Right lower leg: Edema (1+2 proximal tibia bilaterally, no skin changes or ulceration.)  present.     Left lower leg: Edema present.  Skin:    General: Skin is warm and dry.  Neurological:     General: No focal deficit present.     Mental Status: She is alert and oriented to person, place, and time.  Motor: No weakness.     Coordination: Coordination normal.  Psychiatric:        Behavior: Behavior normal.    Vitals:   06/12/19 0834  BP: 113/75  Pulse: 79  Resp: 14  Temp: 98.7 F (37.1 C)  TempSrc: Oral  SpO2: 98%  Weight: 235 lb (106.6 kg)        Assessment & Plan:   Hannah Buchanan is a 48 y.o. female DOE (dyspnea on exertion) - Plan: Ambulatory referral to Cardiology  -No chest pains.  EKG, x-ray, blood work from last visit reassuring.  Differential includes deconditioning but will refer back to cardiology to decide on further testing.  ER/RTC precautions if acute worsening  Peripheral edema - Plan: Ambulatory referral to Cardiology  -Continue to monitor salt intake, hydration.  Compression stockings discussed.  Cardiology referral placed.  Nonintractable episodic headache, unspecified headache type  -Nonfocal exam, now 80% improved.  No other associated symptoms.  RTC/ER precautions if persistent or worsening symptoms.  Maintain hydration as above.  No orders of the defined types were placed in this encounter.  Patient Instructions     Try compression stockings, feet elevation during the day as tolerated for swelling.  See other condition below.  Make sure to stay hydrated throughout the day.  If headache is not continue to improve, follow-up to discuss further.  I will refer you to cardiology to discuss the swelling and shortness of breath on exertion but previous testing looked okay.  Return to the clinic or go to the nearest emergency room if any of your symptoms worsen or new symptoms occur.   Peripheral Edema  Peripheral edema is swelling that is caused by a buildup of fluid. Peripheral edema most often affects the lower legs, ankles, and  feet. It can also develop in the arms, hands, and face. The area of the body that has peripheral edema will look swollen. It may also feel heavy or warm. Your clothes may start to feel tight. Pressing on the area may make a temporary dent in your skin. You may not be able to move your swollen arm or leg as much as usual. There are many causes of peripheral edema. It can happen because of a complication of other conditions such as congestive heart failure, kidney disease, or a problem with your blood circulation. It also can be a side effect of certain medicines or because of an infection. It often happens to women during pregnancy. Sometimes, the cause is not known. Follow these instructions at home: Managing pain, stiffness, and swelling   Raise (elevate) your legs while you are sitting or lying down.  Move around often to prevent stiffness and to lessen swelling.  Do not sit or stand for long periods of time.  Wear support stockings as told by your health care provider. Medicines  Take over-the-counter and prescription medicines only as told by your health care provider.  Your health care provider may prescribe medicine to help your body get rid of excess water (diuretic). General instructions  Pay attention to any changes in your symptoms.  Follow instructions from your health care provider about limiting salt (sodium) in your diet. Sometimes, eating less salt may reduce swelling.  Moisturize skin daily to help prevent skin from cracking and draining.  Keep all follow-up visits as told by your health care provider. This is important. Contact a health care provider if you have:  A fever.  Edema that starts suddenly or is getting worse, especially if  you are pregnant or have a medical condition.  Swelling in only one leg.  Increased swelling, redness, or pain in one or both of your legs.  Drainage or sores at the area where you have edema. Get help right away if you:  Develop  shortness of breath, especially when you are lying down.  Have pain in your chest or abdomen.  Feel weak.  Feel faint. Summary  Peripheral edema is swelling that is caused by a buildup of fluid. Peripheral edema most often affects the lower legs, ankles, and feet.  Move around often to prevent stiffness and to lessen swelling. Do not sit or stand for long periods of time.  Pay attention to any changes in your symptoms.  Contact a health care provider if you have edema that starts suddenly or is getting worse, especially if you are pregnant or have a medical condition.  Get help right away if you develop shortness of breath, especially when lying down. This information is not intended to replace advice given to you by your health care provider. Make sure you discuss any questions you have with your health care provider. Document Released: 11/02/2004 Document Revised: 06/19/2018 Document Reviewed: 06/19/2018 Elsevier Patient Education  2020 Elsevier Inc.  General Headache Without Cause A headache is pain or discomfort that is felt around the head or neck area. There are many causes and types of headaches. In some cases, the cause may not be found. Follow these instructions at home: Watch your condition for any changes. Let your doctor know about them. Take these steps to help with your condition: Managing pain      Take over-the-counter and prescription medicines only as told by your doctor.  Lie down in a dark, quiet room when you have a headache.  If told, put ice on your head and neck area: ? Put ice in a plastic bag. ? Place a towel between your skin and the bag. ? Leave the ice on for 20 minutes, 2-3 times per day.  If told, put heat on the affected area. Use the heat source that your doctor recommends, such as a moist heat pack or a heating pad. ? Place a towel between your skin and the heat source. ? Leave the heat on for 20-30 minutes. ? Remove the heat if your skin  turns bright red. This is very important if you are unable to feel pain, heat, or cold. You may have a greater risk of getting burned.  Keep lights dim if bright lights bother you or make your headaches worse. Eating and drinking  Eat meals on a regular schedule.  If you drink alcohol: ? Limit how much you use to:  0-1 drink a day for women.  0-2 drinks a day for men. ? Be aware of how much alcohol is in your drink. In the [place] one drink equals one 12 oz bottle of beer (355 mL), one 5 oz glass of wine (148 mL), or one 1 oz glass of hard liquor (44 mL).  Stop drinking caffeine, or reduce how much caffeine you drink. General instructions   Keep a journal to find out if certain things bring on headaches. For example, write down: ? What you eat and drink. ? How much sleep you get. ? Any change to your diet or medicines.  Get a massage or try other ways to relax.  Limit stress.  Sit up straight. Do not tighten (tense) your muscles.  Do not use any products that contain  nicotine or tobacco. This includes cigarettes, e-cigarettes, and chewing tobacco. If you need help quitting, ask your doctor.  Exercise regularly as told by your doctor.  Get enough sleep. This often means 7-9 hours of sleep each night.  Keep all follow-up visits as told by your doctor. This is important. Contact a doctor if:  Your symptoms are not helped by medicine.  You have a headache that feels different than the other headaches.  You feel sick to your stomach (nauseous) or you throw up (vomit).  You have a fever. Get help right away if:  Your headache gets very bad quickly.  Your headache gets worse after a lot of physical activity.  You keep throwing up.  You have a stiff neck.  You have trouble seeing.  You have trouble speaking.  You have pain in the eye or ear.  Your muscles are weak or you lose muscle control.  You lose your balance or have trouble walking.  You feel like you  will pass out (faint) or you pass out.  You are mixed up (confused).  You have a seizure. Summary  A headache is pain or discomfort that is felt around the head or neck area.  There are many causes and types of headaches. In some cases, the cause may not be found.  Keep a journal to help find out what causes your headaches. Watch your condition for any changes. Let your doctor know about them.  Contact a doctor if you have a headache that is different from usual, or if your headache is not helped by medicine.  Get help right away if your headache gets very bad, you throw up, you have trouble seeing, you lose your balance, or you have a seizure. This information is not intended to replace advice given to you by your health care provider. Make sure you discuss any questions you have with your health care provider. Document Released: 07/04/2008 Document Revised: 04/15/2018 Document Reviewed: 04/15/2018 Elsevier Patient Education  [employer]2020 [employer]   If you have lab work done today you will be contacted with your lab results within the next 2 weeks.  If you have not heard from us then please contact us. The fastest way to get your results is to register for My Chart.   IF you received an x-ray today, you will receive an invoice from [facility]. Please contact [facility] at [phone] with questions or concerns regarding your invoice.   IF you received labwork today, you will receive an invoice from [facility]. Please contact [facility] at [phone] with questions or concerns regarding your invoice.   Our billing staff will not be able to assist you with questions regarding bills from these companies.  You will be contacted with the lab results as soon as they are available. The fastest way to get your results is to activate your My Chart account. Instructions are located on the last page of this paperwork. If you have not heard from us regarding the results in 2  weeks, please contact this office.

## 2019-06-18 ENCOUNTER — Encounter: Payer: Self-pay | Admitting: Family Medicine

## 2019-07-01 ENCOUNTER — Encounter: Payer: Self-pay | Admitting: Family Medicine

## 2019-07-03 ENCOUNTER — Other Ambulatory Visit: Payer: Self-pay

## 2019-07-03 DIAGNOSIS — I10 Essential (primary) hypertension: Secondary | ICD-10-CM

## 2019-07-03 MED ORDER — METOPROLOL SUCCINATE ER 25 MG PO TB24: ORAL_TABLET | ORAL | 0 refills | Status: DC

## 2019-07-18 ENCOUNTER — Ambulatory Visit (INDEPENDENT_AMBULATORY_CARE_PROVIDER_SITE_OTHER): Payer: BLUE CROSS/BLUE SHIELD | Admitting: Cardiology

## 2019-07-18 ENCOUNTER — Encounter: Payer: Self-pay | Admitting: Cardiology

## 2019-07-18 ENCOUNTER — Other Ambulatory Visit: Payer: Self-pay

## 2019-07-18 VITALS — BP 122/88 | HR 72 | Temp 96.9°F | Ht 65.0 in | Wt 233.9 lb

## 2019-07-18 DIAGNOSIS — R0602 Shortness of breath: Secondary | ICD-10-CM

## 2019-07-18 DIAGNOSIS — Z6838 Body mass index (BMI) 38.0-38.9, adult: Secondary | ICD-10-CM | POA: Diagnosis not present

## 2019-07-18 DIAGNOSIS — E6609 Other obesity due to excess calories: Secondary | ICD-10-CM | POA: Diagnosis not present

## 2019-07-18 DIAGNOSIS — R600 Localized edema: Secondary | ICD-10-CM

## 2019-07-19 NOTE — Progress Notes (Signed)
Primary Physician/Referring:  Shade FloodGreene, Jeffrey R, MD  Patient ID: Hannah Buchanan, female    DOB: 1971/03/18, 48 y.o.   MRN: [id number]  Chief Complaint  Patient presents with  . New Patient (Initial Visit)  . Edema  . Shortness of Breath  . Decrease in urine output   HPI:    Hannah Buchanan  is a 48 y.o. Caucasian female with history of esophageal stricture S/P dilatation in 2011, vasovagal syncope in January 2017, echocardiogram in 2014 was essentially normal with grade 1 diastolic dysfunction and nuclear stress test on 02/23/2012 was nonischemic.  She is now referred back to me for evaluation of leg edema ongoing for the past few weeks.  Patient states that over the past 2 months or so she has noticed she acute minutes fluid in her legs and thinks that she may be passing less urine.  She is being contemplated for evaluation of CHF and renal problems but wanted to see me 1st.  Patient notices leg edema more towards the end of the day.  Over the past one to 2 years, she has taken up desk job.  No chest pain, states that she has shortness of breath with exertion activity but does state it is chronic and has been stable over many years and attributes to her weight.  No PND or orthopnea.  No palpitations, dizziness or syncope.  Past Medical History:  Diagnosis Date  . Allergy    Claritin daily yearround.  . Esophageal stricture   . GERD (gastroesophageal reflux disease)   . Hiatal hernia   . Hypertension   . Pneumonia   . Recurrent cold sores   . Status post dilation of esophageal narrowing    Past Surgical History:  Procedure Laterality Date  . Admission  10/09/1998   prolonged syncopal event; admission for 24 hours.  [place]  . BLADDER REPAIR  1998   David Lowe/Gynecology.  [facility] Kitchen. BREAST SURGERY  1999   breast reduction  . KNEE ARTHROSCOPY Right    10 yrs ago   Social History   Socioeconomic History  . Marital status: Married    Spouse name: Not on file  . Number of children: 2   . Years of education: Not on file  . Highest education level: Not on file  Occupational History  . Occupation: Engineer, sitemedical assistant    Employer: [employer]  Social Needs  . Financial resource strain: Not on file  . Food insecurity    Worry: Not on file    Inability: Not on file  . Transportation needs    Medical: Not on file    Non-medical: Not on file  Tobacco Use  . Smoking status: Never Smoker  . Smokeless tobacco: Never Used  Substance and Sexual Activity  . Alcohol use: Yes    Comment: occasional  . Drug use: No  . Sexual activity: Yes  Lifestyle  . Physical activity    Days per week: Not on file    Minutes per session: Not on file  . Stress: Not on file  Relationships  . Social Musicianconnections    Talks on phone: Not on file    Gets together: Not on file    Attends religious service: Not on file    Active member of club or organization: Not on file    Attends meetings of clubs or organizations: Not on file    Relationship status: Not on file  . Intimate partner violence    Fear of  current or ex partner: Not on file    Emotionally abused: Not on file    Physically abused: Not on file    Forced sexual activity: Not on file  Other Topics Concern  . Not on file  Social History Narrative   Marital status: married x 26 years; happily      Chldren: 2 children (102, 73); no grandchldren.      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   ROS  Review of Systems  Constitution: Negative for chills, decreased appetite, malaise/fatigue and weight gain.  Cardiovascular: Positive for dyspnea on exertion and leg swelling. Negative for syncope.  Endocrine: Negative for cold intolerance.  Hematologic/Lymphatic: Does not bruise/bleed easily.  Musculoskeletal: Negative for joint  swelling.  Gastrointestinal: Negative for abdominal pain, anorexia, change in bowel habit, hematochezia and melena.  Neurological: Negative for headaches and light-headedness.  Psychiatric/Behavioral: Negative for depression and substance abuse.  All other systems reviewed and are negative.  Objective   Vitals with BMI 07/18/2019 06/12/2019 05/30/2019  Height 5\' 5"  - -  Weight 233 lbs 14 oz 235 lbs 237 lbs 13 oz  BMI 48.18 - -  Systolic 563 149 702  Diastolic 88 75 70  Pulse 72 79 72    Blood pressure 122/88, pulse 72, temperature (!) 96.9 F (36.1 C), height 5\' 5"  (1.651 m), weight 233 lb 14.4 oz (106.1 kg), SpO2 97 %. Body mass index is 38.92 kg/m.   Physical Exam Radiology: No results found.  Laboratory examination:   Recent Labs    05/30/19 1053  NA 138  K 4.7  CL 101  CO2 24  GLUCOSE 105*  BUN 19  CREATININE 0.79  CALCIUM 9.3  GFRNONAA 88  GFRAA 102   CMP Latest Ref Rng & Units 05/30/2019 05/24/2018 05/18/2017  Glucose 65 - 99 mg/dL 105(H) 104(H) 98  BUN 6 - 24 mg/dL 19 9 15   Creatinine 0.57 - 1.00 mg/dL 0.79 0.85 0.90  Sodium 134 - 144 mmol/L 138 142 139  Potassium 3.5 - 5.2 mmol/L 4.7 4.4 4.7  Chloride 96 - 106 mmol/L 101 105 102  CO2 20 - 29 mmol/L 24 25 22   Calcium 8.7 - 10.2 mg/dL 9.3 9.5 9.1  Total Protein 6.0 - 8.5 g/dL 6.7 6.5 6.7  Total Bilirubin 0.0 - 1.2 mg/dL 0.4 0.4 0.4  Alkaline Phos 39 - 117 IU/L 84 77 69  AST 0 - 40 IU/L 24 18 18   ALT 0 - 32 IU/L 42(H) 21 19   CBC Latest Ref Rng & Units 05/30/2019 05/24/2018 05/18/2017  WBC 3.4 - 10.8 x10E3/uL 5.8 6.7 5.6  Hemoglobin 11.1 - 15.9 g/dL 13.6 12.9 13.0  Hematocrit 34.0 - 46.6 % 41.2 39.9 38.8  Platelets 150 - 450 x10E3/uL 169 188 171   Lipid Panel     Component Value Date/Time   CHOL 189 05/24/2018 0946   TRIG 107 05/24/2018 0946   HDL 52 05/24/2018 0946   CHOLHDL 3.6 05/24/2018 0946   LDLCALC 116 (H) 05/24/2018 0946   HEMOGLOBIN A1C Lab Results  Component Value Date   HGBA1C 5.0  05/18/2017   MPG 103 11/18/2014   TSH Recent Labs  05/30/19 1053  TSH 1.880   Medications and allergies   Allergies  Allergen Reactions  . Gluten Meal Other (See Comments)  . Wheat Bran      Prior to Admission medications   Medication Sig Start Date End Date Taking? Authorizing Provider  docusate sodium (STOOL SOFTENER) 100 MG capsule Take 100 mg by mouth as needed.    Yes [provider]  loratadine (CLARITIN) 10 MG tablet Take 10 mg by mouth daily.   Yes [provider]  MAGNESIUM SULFATE PO as needed. 05/28/18  Yes [provider]  meloxicam (MOBIC) 15 MG tablet 7.5 mg as needed.  05/08/18  Yes [provider]  metoprolol succinate (TOPROL-XL) 25 MG 24 hr tablet TAKE 1 AND 1/2 TABLETS(37.5 MG) BY MOUTH DAILY 07/03/19  Yes Shade Flood, MD  valACYclovir (VALTREX) 500 MG tablet Take 500 mg by mouth as needed.   Yes [provider]     Current Outpatient Medications  Medication Instructions  . docusate sodium (STOOL SOFTENER) 100 mg, Oral, As needed  . loratadine (CLARITIN) 10 mg, Daily  . MAGNESIUM SULFATE PO As needed  . meloxicam (MOBIC) 7.5 mg, As needed  . metoprolol succinate (TOPROL-XL) 25 MG 24 hr tablet TAKE 1 AND 1/2 TABLETS(37.5 MG) BY MOUTH DAILY  . valACYclovir (VALTREX) 500 mg, As needed    Cardiac Studies:   Echocardiogram  11/27/12: 1.Left ventricular cavity is normal in size.   Normal global wall motion.   Normal systolic global function.   Calculated EF 65%.   Doppler evidence of Grade I (impaired) diastolic dysfunction. 2.Mitral valve structurally normal.   Mitral valve inflow A > E ratio. 3.Tricuspid valve structurally normal.   Mild tricuspid regurgitation.  Assessment     ICD-10-CM   1. Bilateral leg edema  R60.0 EKG 12-Lead  2. SOB (shortness of breath)  R06.02 EKG 12-Lead  3. Class 2 obesity due to excess calories without serious comorbidity with body mass index (BMI) of 38.0 to 38.9 in adult   E66.09    Z68.38     EKG 07/18/2019: Normal sinus rhythm at rate of 60 bpm, normal axis.  Incomplete right bundle branch block.  T wave inversion in V1 & V3, cannot exclude ischemia.  No significant change from 06/12/2013.  Recommendations:   Patient referred to me for evaluation of leg edema, on exam, she has significant amount of adipose tissue in her lower extremities and has trace edema today but I do believe that she gets significant leg edema at the end of the day.  Etiology include job that involves chest constantly sitting, obesity and her diet along with possible development of venous engorgement from her posture.  She has no clinical evidence of congestive heart failure, recent BNP is also normal.  I have reassured her.  She does not have any significant urinary laboratory abnormality, doubt she has renal issues.  Advised her to have a standup desk and to constantly moving her feet and also to wear support stockings during work hours.  I have discussed with her regarding weight loss again and also making diet changes.  She does have mildly abnormal ALT related to probably fatty liver.  She also has mild hyperlipidemia and THIS can change with her weight loss and making diet changes.  Patient felt reassured. I will see her back on a p.r.n. basis.  Yates Decamp, MD, [facility] 07/19/2019, 3:14 PM [facility] [place] Pager: [phone] Office: [phone] If no answer Cell [phone]

## 2019-08-01 ENCOUNTER — Other Ambulatory Visit: Payer: Self-pay | Admitting: Family Medicine

## 2019-08-01 DIAGNOSIS — I10 Essential (primary) hypertension: Secondary | ICD-10-CM

## 2019-09-03 ENCOUNTER — Other Ambulatory Visit: Payer: Self-pay

## 2019-09-03 ENCOUNTER — Other Ambulatory Visit: Payer: Self-pay | Admitting: Family Medicine

## 2019-09-03 DIAGNOSIS — I10 Essential (primary) hypertension: Secondary | ICD-10-CM

## 2019-09-03 MED ORDER — METOPROLOL SUCCINATE ER 25 MG PO TB24: ORAL_TABLET | ORAL | 0 refills | Status: DC

## 2019-09-03 NOTE — Telephone Encounter (Signed)
meds sent, pt notified via mychart

## 2019-10-28 DIAGNOSIS — K222 Esophageal obstruction: Secondary | ICD-10-CM | POA: Insufficient documentation

## 2019-10-28 DIAGNOSIS — I10 Essential (primary) hypertension: Secondary | ICD-10-CM | POA: Insufficient documentation

## 2019-10-28 DIAGNOSIS — N319 Neuromuscular dysfunction of bladder, unspecified: Secondary | ICD-10-CM | POA: Insufficient documentation

## 2019-11-26 ENCOUNTER — Other Ambulatory Visit: Payer: Self-pay | Admitting: Family Medicine

## 2019-11-26 DIAGNOSIS — I10 Essential (primary) hypertension: Secondary | ICD-10-CM

## 2020-01-23 DIAGNOSIS — M545 Low back pain, unspecified: Secondary | ICD-10-CM | POA: Insufficient documentation

## 2020-02-23 ENCOUNTER — Other Ambulatory Visit: Payer: Self-pay | Admitting: Family Medicine

## 2020-02-23 DIAGNOSIS — I10 Essential (primary) hypertension: Secondary | ICD-10-CM

## 2020-02-25 DIAGNOSIS — M25561 Pain in right knee: Secondary | ICD-10-CM | POA: Insufficient documentation

## 2020-02-25 DIAGNOSIS — M1711 Unilateral primary osteoarthritis, right knee: Secondary | ICD-10-CM | POA: Insufficient documentation

## 2020-03-12 ENCOUNTER — Encounter: Payer: Self-pay | Admitting: Gastroenterology

## 2020-05-25 ENCOUNTER — Other Ambulatory Visit: Payer: Self-pay | Admitting: Family Medicine

## 2020-05-25 DIAGNOSIS — I10 Essential (primary) hypertension: Secondary | ICD-10-CM

## 2020-05-25 NOTE — Telephone Encounter (Signed)
Requested medications are due for refill today?  Yes  Requested medications are on active medication list?  Yes  Last Refill:   02/23/2020  # 135 with no refills   Future visit scheduled?  No   Notes to Clinic:  Medication failed RX refill protocol due to no valid encounter in the past 6 months.  Last office visit was 11 months ago.

## 2020-06-21 ENCOUNTER — Other Ambulatory Visit: Payer: Self-pay

## 2020-06-21 ENCOUNTER — Ambulatory Visit
Admission: EM | Admit: 2020-06-21 | Discharge: 2020-06-21 | Disposition: A | Discharge status: Home / Self Care | Payer: BC Managed Care – PPO | Attending: Physician Assistant | Admitting: Physician Assistant

## 2020-06-21 DIAGNOSIS — R05 Cough: Secondary | ICD-10-CM | POA: Diagnosis present

## 2020-06-21 DIAGNOSIS — R059 Cough, unspecified: Secondary | ICD-10-CM

## 2020-06-21 DIAGNOSIS — J029 Acute pharyngitis, unspecified: Secondary | ICD-10-CM | POA: Diagnosis not present

## 2020-06-21 LAB — POCT RAPID STREP A (OFFICE): Rapid Strep A Screen: NEGATIVE

## 2020-06-21 MED ORDER — LIDOCAINE VISCOUS HCL 2 % MT SOLN: 10.0000 mL | OROMUCOSAL | 0 refills | Status: DC | PRN

## 2020-06-21 NOTE — ED Provider Notes (Signed)
[facility]    CSN: [id number] Arrival date & time: 06/21/20  1008      History   Chief Complaint Chief Complaint  Patient presents with  . Sore Throat  . Nasal Congestion    HPI Hannah Buchanan is a 49 y.o. female.   49 year old female presents with sore throat, nasal congestion, and slight cough for the past 3 days. Also having some laryngitis. Slight low grade fever at first but none recently. Denies any GI symptoms. Also recently broke out with a cold sore. Did not take Valtrex since usually only helps when it first starts. 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 Has had 3 negative COVID tests recently. Has not had the COVID 19 vaccine. Has taken Ibuprofen and Tylenol with some relief. Other chronic health issues include HTN, environmental allergies, GERD and joint pain. Currently on Toprol, and Claritin daily and Mobic and Valtrex prn.   The history is provided by the patient.    Past Medical History:  Diagnosis Date  . Allergy    Claritin daily yearround.  . Esophageal stricture   . GERD (gastroesophageal reflux disease)   . Hiatal hernia   . Hypertension   . Pneumonia   . Recurrent cold sores   . Status post dilation of esophageal narrowing     Patient Active Problem List   Diagnosis Date Noted  . Allergic rhinitis due to pollen 11/18/2014  . Essential hypertension, benign 11/18/2014  . GERD 11/08/2009  . OTHER DYSPHAGIA 11/08/2009  . FLATULENCE ERUCTATION AND GAS PAIN 11/08/2009    Past Surgical History:  Procedure Laterality Date  . Admission  10/09/1998   prolonged syncopal event; admission for 24 hours.  [place]  . BLADDER REPAIR  1998   David Lowe/Gynecology.  Marland Kitchen BREAST SURGERY  1999   breast reduction  . KNEE ARTHROSCOPY Right    10 yrs ago    OB History   No obstetric history on file.      Home Medications    Prior to Admission medications   Medication Sig Start Date End Date  Taking? Authorizing Provider  docusate sodium (STOOL SOFTENER) 100 MG capsule Take 100 mg by mouth as needed.     [provider]  lidocaine (XYLOCAINE) 2 % solution Use as directed 10 mLs in the mouth or throat every 3 (three) hours as needed (throat pain). Do not swallow- spit out 06/21/20   Sudie Grumbling, NP  loratadine (CLARITIN) 10 MG tablet Take 10 mg by mouth daily.    [provider]  MAGNESIUM SULFATE PO as needed. 05/28/18   [provider]  meloxicam (MOBIC) 15 MG tablet 7.5 mg as needed.  05/08/18   [provider]  metoprolol succinate (TOPROL-XL) 25 MG 24 hr tablet TAKE 1 AND 1/2 TABLET BY MOUTH ONCE DAILY 05/25/20   Shade Flood, MD  valACYclovir (VALTREX) 500 MG tablet Take 500 mg by mouth as needed.    [provider]    Family History Family History  Problem Relation Age of Onset  . Uterine cancer Maternal Grandmother   . Colon polyps Father   . Diabetes Father   . Arthritis Father        TKR B  . Kidney disease Father   . Colon cancer Father 55  . Arthritis Mother   . Asthma Mother   . Depression Sister   . Asthma Sister   .  Migraines Sister   . Alcohol abuse Brother   . Depression Brother   . Thyroid cancer Maternal Aunt   . Cancer Maternal Uncle        unknown origin-Mets    Social History Social History   Tobacco Use  . Smoking status: Never Smoker  . Smokeless tobacco: Never Used  Vaping Use  . Vaping Use: Never used  Substance Use Topics  . Alcohol use: Yes    Comment: occasional  . Drug use: No     Allergies   Gluten meal and Wheat bran   Review of Systems Review of Systems  Constitutional: Positive for fatigue and fever (at first but none recently). Negative for appetite change and chills.  HENT: Positive for congestion, mouth sores, postnasal drip, sore throat and voice change. Negative for ear discharge, ear pain, facial swelling, nosebleeds, sinus pressure, sinus pain and trouble  swallowing.   Eyes: Negative for pain, discharge, redness and itching.  Respiratory: Positive for cough. Negative for chest tightness, shortness of breath and wheezing.   Gastrointestinal: Negative for diarrhea, nausea and vomiting.  Musculoskeletal: Positive for arthralgias and myalgias. Negative for neck pain and neck stiffness.  Skin: Negative for color change, rash and wound.  Allergic/Immunologic: Positive for environmental allergies and food allergies. Negative for immunocompromised state.  Neurological: Negative for dizziness, seizures, syncope, weakness, light-headedness and numbness.  Hematological: Negative for adenopathy. Does not bruise/bleed easily.     Physical Exam Triage Vital Signs ED Triage Vitals  Enc Vitals Group     BP 06/21/20 1101 113/77     Pulse Rate 06/21/20 1101 68     Resp 06/21/20 1101 20     Temp 06/21/20 1101 98.2 F (36.8 C)     Temp Source 06/21/20 1101 Oral     SpO2 06/21/20 1101 98 %     Weight --      Height --      Head Circumference --      Peak Flow --      Pain Score 06/21/20 1102 4     Pain Loc --      Pain Edu? --      Excl. in GC? --    No data found.  Updated Vital Signs BP 113/77 (BP Location: Right Arm)   Pulse 68   Temp 98.2 F (36.8 C) (Oral)   Resp 20   LMP 04/16/2018 (Approximate)   SpO2 98%   Visual Acuity Right Eye Distance:   Left Eye Distance:   Bilateral Distance:    Right Eye Near:   Left Eye Near:    Bilateral Near:     Physical Exam Vitals and nursing note reviewed.  Constitutional:      General: She is awake. She is not in acute distress.    Appearance: She is well-developed and well-groomed.     Comments: She is sitting comfortably on the exam table in no acute distress.   HENT:     Head: Normocephalic and atraumatic.     Right Ear: Hearing, tympanic membrane, ear canal and external ear normal.     Left Ear: Hearing, tympanic membrane, ear canal and external ear normal.     Nose: Congestion  present.     Right Sinus: No maxillary sinus tenderness or frontal sinus tenderness.     Left Sinus: No maxillary sinus tenderness or frontal sinus tenderness.     Mouth/Throat:     Lips: Pink.     Mouth: Mucous membranes are  moist. Oral lesions present.     Palate: Lesions present.     Pharynx: Uvula midline. Pharyngeal swelling and posterior oropharyngeal erythema present. No uvula swelling.      Comments: Cold sore present on right lower outer lip area.   Posterior palate red with a few small ulcer-like lesions. No exudate.  Eyes:     Extraocular Movements: Extraocular movements intact.     Conjunctiva/sclera: Conjunctivae normal.  Cardiovascular:     Rate and Rhythm: Normal rate and regular rhythm.     Heart sounds: Normal heart sounds. No murmur heard.   Pulmonary:     Effort: Pulmonary effort is normal. No respiratory distress.     Breath sounds: Normal breath sounds and air entry. No decreased air movement. No decreased breath sounds, wheezing, rhonchi or rales.  Musculoskeletal:        General: Normal range of motion.     Cervical back: Normal range of motion and neck supple. No rigidity.  Lymphadenopathy:     Cervical: No cervical adenopathy.  Skin:    General: Skin is warm and dry.  Neurological:     General: No focal deficit present.     Mental Status: She is alert and oriented to person, place, and time.  Psychiatric:        Mood and Affect: Mood normal.        Behavior: Behavior normal. Behavior is cooperative.        Thought Content: Thought content normal.        Judgment: Judgment normal.      UC Treatments / Results  Labs (all labs ordered are listed, but only abnormal results are displayed) Labs Reviewed  CULTURE, GROUP A STREP [facility])  POCT RAPID STREP A (OFFICE)    EKG   Radiology No results found.  Procedures Procedures (including critical care time)  Medications Ordered in UC Medications - No data to display  Initial Impression /  Assessment and Plan / UC Course  I have reviewed the triage vital signs and the nursing notes.  Pertinent labs & imaging results that were available during my care of the patient were reviewed by me and considered in my medical decision making (see chart for details).    Reviewed negative rapid strep test with patient. Discussed that she probably has a viral illness, especially with presence of a cold sore and ulcer-like lesions on posterior palate. Recommend trial Viscous lidocaine 2 teaspoons every 3 to 4 hours as needed. Continue Ibuprofen 600mg  and alternate with Tylenol 1000mg  every 4 hours as needed for throat pain. Continue to push fluids. Follow-up pending strep culture results and in 3 to 4 days if not improving.   Final Clinical Impressions(s) / UC Diagnoses   Final diagnoses:  Acute sore throat  Cough     Discharge Instructions     Recommend use Viscous lidocaine 2 teaspoons swish and spit out every 3 to 4 hours as needed. Continue Ibuprofen and alternate with Tylenol as needed for throat pain. Continue to push fluids. Follow-up pending strep culture results and in 3 to 4 days if not improving.     ED Prescriptions    Medication Sig Dispense Auth. Provider   lidocaine (XYLOCAINE) 2 % solution Use as directed 10 mLs in the mouth or throat every 3 (three) hours as needed (throat pain). Do not swallow- spit out 100 mL Lelynd Poer, , NP     PDMP not reviewed this encounter.   , NP 06/21/20  2022  

## 2020-06-21 NOTE — Discharge Instructions (Addendum)
Recommend use Viscous lidocaine 2 teaspoons swish and spit out every 3 to 4 hours as needed. Continue Ibuprofen and alternate with Tylenol as needed for throat pain. Continue to push fluids. Follow-up pending strep culture results and in 3 to 4 days if not improving.

## 2020-06-21 NOTE — ED Triage Notes (Signed)
Pt present a sore throat with congestion. Symptoms started on Friday night.

## 2020-06-24 LAB — CULTURE, GROUP A STREP (THRC)

## 2020-06-29 ENCOUNTER — Other Ambulatory Visit: Payer: Self-pay | Admitting: Family Medicine

## 2020-06-29 DIAGNOSIS — I10 Essential (primary) hypertension: Secondary | ICD-10-CM

## 2020-06-29 NOTE — Telephone Encounter (Signed)
Requested medications are due for refill today?  (yes or no)  Yes  Requested medications are on active medication list?  Yes  Last Refill:  05/25/2020  # 45 with no refills  Future visit scheduled?  No   Notes to Clinic:  Medication failed Rx refill protocol due to no valid encounter in the past 6 months.  Last office visit was on 06/12/2019.

## 2020-06-29 NOTE — Telephone Encounter (Signed)
Pt needs office visit. After a visit is on file we can send a [place]y refill until appointment date.

## 2020-06-29 NOTE — Telephone Encounter (Signed)
Called pt. And left VM detailing need for OV

## 2020-07-02 ENCOUNTER — Other Ambulatory Visit: Payer: Self-pay | Admitting: Family Medicine

## 2020-07-02 DIAGNOSIS — I10 Essential (primary) hypertension: Secondary | ICD-10-CM

## 2020-07-05 ENCOUNTER — Other Ambulatory Visit: Payer: Self-pay

## 2020-07-05 ENCOUNTER — Telehealth: Payer: Self-pay

## 2020-07-05 DIAGNOSIS — I10 Essential (primary) hypertension: Secondary | ICD-10-CM

## 2020-07-05 MED ORDER — METOPROLOL SUCCINATE ER 25 MG PO TB24: ORAL_TABLET | ORAL | 0 refills | Status: DC

## 2020-07-05 NOTE — Telephone Encounter (Signed)
Curtesy refill sent in with a pharmacy note stating pt must keep up coming appt for future refills.

## 2020-07-05 NOTE — Telephone Encounter (Signed)
Pt. Has schedule an appt and is requesting a temp refill on metoprolol succinate

## 2020-07-10 ENCOUNTER — Ambulatory Visit
Admission: EM | Admit: 2020-07-10 | Discharge: 2020-07-10 | Disposition: A | Discharge status: Home / Self Care | Payer: BC Managed Care – PPO | Attending: Family Medicine | Admitting: Family Medicine

## 2020-07-10 ENCOUNTER — Other Ambulatory Visit: Payer: Self-pay

## 2020-07-10 DIAGNOSIS — Z1152 Encounter for screening for COVID-19: Secondary | ICD-10-CM

## 2020-07-10 NOTE — ED Triage Notes (Signed)
Pt here for covid test post exposure; pt denies sx

## 2020-07-12 LAB — NOVEL CORONAVIRUS, NAA: SARS-CoV-2, NAA: NOT DETECTED

## 2020-07-12 LAB — SARS-COV-2, NAA 2 DAY TAT

## 2020-07-26 ENCOUNTER — Encounter: Payer: Self-pay | Admitting: Family Medicine

## 2020-07-26 ENCOUNTER — Ambulatory Visit: Payer: BC Managed Care – PPO | Admitting: Family Medicine

## 2020-07-26 ENCOUNTER — Other Ambulatory Visit: Payer: Self-pay

## 2020-07-26 VITALS — BP 123/75 | HR 71 | Temp 98.4°F | Ht 65.0 in | Wt 217.0 lb

## 2020-07-26 DIAGNOSIS — I10 Essential (primary) hypertension: Secondary | ICD-10-CM | POA: Diagnosis not present

## 2020-07-26 DIAGNOSIS — Z13220 Encounter for screening for lipoid disorders: Secondary | ICD-10-CM | POA: Diagnosis not present

## 2020-07-26 DIAGNOSIS — R739 Hyperglycemia, unspecified: Secondary | ICD-10-CM

## 2020-07-26 MED ORDER — METOPROLOL SUCCINATE ER 25 MG PO TB24: ORAL_TABLET | ORAL | 3 refills | Status: DC

## 2020-07-26 NOTE — Patient Instructions (Addendum)
Continue same dose of medication for now.  If blood pressure is well controlled you can try going down to just 1 Toprol per day but closely monitor blood pressure and if running over 130/80, return to 37.5 mg dose.  Lab only visit in 4 days, follow-up in 6 months for physical and we can review medications at that time.  Take care.     If you have lab work done today you will be contacted with your lab results within the next 2 weeks.  If you have not heard from [place] then please contact us. The fastest way to get your results is to register for My Chart.   IF you received an x-ray today, you will receive an invoice from [facility]. Please contact [facility] at [phone] with questions or concerns regarding your invoice.   IF you received labwork today, you will receive an invoice from [facility]. Please contact [facility] at [phone] with questions or concerns regarding your invoice.   Our billing staff will not be able to assist you with questions regarding bills from these companies.  You will be contacted with the lab results as soon as they are available. The fastest way to get your results is to activate your My Chart account. Instructions are located on the last page of this paperwork. If you have not heard from [place] regarding the results in 2 weeks, please contact this office.

## 2020-07-26 NOTE — Progress Notes (Signed)
Subjective:  Patient ID: Hannah Buchanan, female    DOB: 29-Aug-1971  Age: 49 y.o. MRN: [id number]  CC:  Chief Complaint  Patient presents with  . Hypertension    pt reports no issues with BP since last OV.PT checks BP at home and gets readings around what todays reading was at 123/75. pt reports some headaches, but no other symptoms of hypertenstion.  . Medication Refill    on Metoprolol. pt reports this medications works well for her with no known side effects.    HPI Hannah Buchanan presents for  Hypertension: toprol XL 37.5mg  QD.  Last discussed in September 2020.  Some dyspnea on exertion and peripheral edema discussed at that time.  Compression stockings, salt intake discussed for peripheral edema. Cardiology visit October 2020.  Normal BNP.  Reassuring cardiology eval.  Symptomatic care discussed for peripheral edema. No further DOE. Started walking and rowing more. 10-14k steps per day.  Less swelling - lost weight - lost 25 #, some regain in past few weeks with stress eating, husband with covid, but recovering and father now with terminal cancer - unknown timing.  Occasional stress HA - tylenol helps. Last ate 3 hrs ago.    Wt Readings from Last 3 Encounters:  07/26/20 217 lb (98.4 kg)  07/18/19 233 lb 14.4 oz (106.1 kg)  06/12/19 235 lb (106.6 kg)   Home readings: BP Readings from Last 3 Encounters:  07/26/20 123/75  06/21/20 113/77  07/18/19 122/88   Lab Results  Component Value Date   CREATININE 0.79 05/30/2019    Hyperglycemia: Glucose 105 on 05/2019.  Lab Results  Component Value Date   HGBA1C 5.0 05/18/2017    Health maintenance:  Flu vaccine: declines.  Covid vaccine: has not had, not planning on getting un Mammogram: last year - planned in next few months.  Colonoscopy: father with hx colon cancer. She has not had screening yet. Plans to call Dr. Russella Dar to schedule.   Pap testing: last year - has planned in next 2 months.    History Patient Active  Problem List   Diagnosis Date Noted  . Allergic rhinitis due to pollen 11/18/2014  . Essential hypertension, benign 11/18/2014  . GERD 11/08/2009  . OTHER DYSPHAGIA 11/08/2009  . FLATULENCE ERUCTATION AND GAS PAIN 11/08/2009   Past Medical History:  Diagnosis Date  . Allergy    Claritin daily yearround.  . Esophageal stricture   . GERD (gastroesophageal reflux disease)   . Hiatal hernia   . Hypertension   . Pneumonia   . Recurrent cold sores   . Status post dilation of esophageal narrowing    Past Surgical History:  Procedure Laterality Date  . Admission  10/09/1998   prolonged syncopal event; admission for 24 hours.  [place]  . BLADDER REPAIR  1998   [facility]/Gynecology.  Mar[facility] Kitchen BREAST SURGERY  1999   breast reduction  . KNEE ARTHROSCOPY Right    10 yrs ago   Allergies  Allergen Reactions  . Gluten Meal Other (See Comments)  . Wheat Bran    Prior to Admission medications   Medication Sig Start Date End Date Taking? Authorizing Provider  docusate sodium (STOOL SOFTENER) 100 MG capsule Take 100 mg by mouth as needed.    Yes [provider]  loratadine (CLARITIN) 10 MG tablet Take 10 mg by mouth daily.   Yes [provider]  meloxicam (MOBIC) 15 MG tablet 7.5 mg as needed.  05/08/18  Yes [provider]  metoprolol succinate (TOPROL-XL) 25 MG 24 hr tablet TAKE 1 AND 1/2 TABLET BY MOUTH ONCE DAILY 07/05/20  Yes Shade Flood, MD  valACYclovir (VALTREX) 500 MG tablet Take 500 mg by mouth as needed.   Yes [provider]  lidocaine (XYLOCAINE) 2 % solution Use as directed 10 mLs in the mouth or throat every 3 (three) hours as needed (throat pain). Do not swallow- spit out Patient not taking: Reported on 07/26/2020 06/21/20   Sudie Grumbling, NP  MAGNESIUM SULFATE PO as needed. 05/28/18   [provider]   Social History   Socioeconomic History  . Marital status: Married    Spouse name: Not on file  . Number of children: 2  .  Years of education: Not on file  . Highest education level: Not on file  Occupational History  . Occupation: Engineer, site    Employer: [employer]  Tobacco Use  . Smoking status: Never Smoker  . Smokeless tobacco: Never Used  Vaping Use  . Vaping Use: Never used  Substance and Sexual Activity  . Alcohol use: Yes    Comment: occasional  . Drug use: No  . Sexual activity: Yes  Other Topics Concern  . Not on file  Social History Narrative   Marital status: married x 26 years; happily      Chldren: 2 children (22, 20); no grandchldren.      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   Social Determinants of Health   Financial Resource Strain:   . Difficulty of Paying Living Expenses: Not on file  Food Insecurity:   . Worried About Programme researcher, broadcasting/film/video in the Last Year: Not on file  . Ran Out of Food in the Last Year: Not on file  Transportation Needs:   . Lack of Transportation (Medical): Not on file  . Lack of Transportation (Non-Medical): Not on file  Physical Activity:   . Days of Exercise per Week: Not on file  . Minutes of Exercise per Session: Not on file  Stress:   . Feeling of Stress : Not on file  Social Connections:   . Frequency of Communication with Friends and Family: Not on file  . Frequency of Social Gatherings with Friends and Family: Not on file  . Attends Religious Services: Not on file  . Active Member of Clubs or Organizations: Not on file  . Attends Banker Meetings: Not on file  . Marital Status: Not on file  Intimate Partner Violence:   . Fear of Current or Ex-Partner: Not on file  . Emotionally Abused: Not on file  . Physically Abused: Not on file  . Sexually Abused: Not on file    Review of Systems  Constitutional:  Negative for fatigue and unexpected weight change.  Respiratory: Negative for chest tightness and shortness of breath.   Cardiovascular: Negative for chest pain, palpitations and leg swelling.  Gastrointestinal: Negative for abdominal pain and blood in stool.  Neurological: Positive for headaches (tension/stress only. ). Negative for dizziness, syncope and light-headedness.     Objective:   Vitals:   07/26/20 1549  BP: 123/75  Pulse: 71  Temp:  98.4 F (36.9 C)  TempSrc: Temporal  SpO2: 97%  Weight: 217 lb (98.4 kg)  Height: 5\' 5"  (1.651 m)     Physical Exam Vitals reviewed.  Constitutional:      Appearance: She is well-developed.  HENT:     Head: Normocephalic and atraumatic.  Eyes:     Conjunctiva/sclera: Conjunctivae normal.     Pupils: Pupils are equal, round, and reactive to light.  Neck:     Vascular: No carotid bruit.  Cardiovascular:     Rate and Rhythm: Normal rate and regular rhythm.     Heart sounds: Normal heart sounds.  Pulmonary:     Effort: Pulmonary effort is normal.     Breath sounds: Normal breath sounds.  Abdominal:     Palpations: Abdomen is soft. There is no pulsatile mass.     Tenderness: There is no abdominal tenderness.  Musculoskeletal:     Right lower leg: No edema.     Left lower leg: No edema.  Skin:    General: Skin is warm and dry.  Neurological:     Mental Status: She is alert and oriented to person, place, and time.  Psychiatric:        Behavior: Behavior normal.        Assessment & Plan:  Hannah Buchanan is a 49 y.o. female . Essential hypertension - Plan: metoprolol succinate (TOPROL-XL) 25 MG 24 hr tablet  -  Stable, tolerating current regimen. Medications refilled. Labs pending  -plan for lab only visit for fasting labs.  Screening for hyperlipidemia - Plan: Comprehensive metabolic panel, Lipid panel  Hyperglycemia - Plan: Hemoglobin A1c, Comprehensive metabolic panel   Meds ordered this encounter  Medications  .  metoprolol succinate (TOPROL-XL) 25 MG 24 hr tablet    Sig: TAKE 1 AND 1/2 TABLET BY MOUTH ONCE DAILY    Dispense:  135 tablet    Refill:  3   Patient Instructions   Continue same dose of medication for now.  If blood pressure is well controlled you can try going down to just 1 Toprol per day but closely monitor blood pressure and if running over 130/80, return to 37.5 mg dose.  Lab only visit in 4 days, follow-up in 6 months for physical and we can review medications at that time.  Take care.     If you have lab work done today you will be contacted with your lab results within the next 2 weeks.  If you have not heard from 44 then please contact us. The fastest way to get your results is to register for My Chart.   IF you received an x-ray today, you will receive an invoice from [facility]. Please contact [facility] at [phone] with questions or concerns regarding your invoice.   IF you received labwork today, you will receive an invoice from [facility]. Please contact [facility] at [phone] with questions or concerns regarding your invoice.   Our billing staff will not be able to assist you with questions regarding bills from these companies.  You will be contacted with the lab results as soon as they are available. The fastest way to get your results is to activate your My Chart account. Instructions are located on the last page of this paperwork. If you have not heard from [phone] regarding the results in 2 weeks, please contact this office.         Signed, [place], MD [facility]

## 2020-07-30 ENCOUNTER — Other Ambulatory Visit: Payer: Self-pay

## 2020-07-30 ENCOUNTER — Ambulatory Visit: Payer: BC Managed Care – PPO

## 2020-07-30 DIAGNOSIS — R739 Hyperglycemia, unspecified: Secondary | ICD-10-CM

## 2020-07-30 DIAGNOSIS — Z13220 Encounter for screening for lipoid disorders: Secondary | ICD-10-CM

## 2020-07-30 LAB — COMPREHENSIVE METABOLIC PANEL
ALT: 21 IU/L (ref 0–32)
AST: 17 IU/L (ref 0–40)
Albumin/Globulin Ratio: 1.9 (ref 1.2–2.2)
Albumin: 4.5 g/dL (ref 3.8–4.8)
Alkaline Phosphatase: 87 IU/L (ref 44–121)
BUN/Creatinine Ratio: 22 (ref 9–23)
BUN: 19 mg/dL (ref 6–24)
Bilirubin Total: 0.5 mg/dL (ref 0.0–1.2)
CO2: 26 mmol/L (ref 20–29)
Calcium: 9.7 mg/dL (ref 8.7–10.2)
Chloride: 104 mmol/L (ref 96–106)
Creatinine, Ser: 0.86 mg/dL (ref 0.57–1.00)
GFR calc Af Amer: 91 mL/min/{1.73_m2} (ref >59)
GFR calc non Af Amer: 79 mL/min/{1.73_m2} (ref >59)
Globulin, Total: 2.4 g/dL (ref 1.5–4.5)
Glucose: 104 mg/dL — ABNORMAL HIGH (ref 65–99)
Potassium: 5.1 mmol/L (ref 3.5–5.2)
Sodium: 139 mmol/L (ref 134–144)
Total Protein: 6.9 g/dL (ref 6.0–8.5)

## 2020-07-30 LAB — LIPID PANEL
Chol/HDL Ratio: 3.8 ratio (ref 0.0–4.4)
Cholesterol, Total: 201 mg/dL — ABNORMAL HIGH (ref 100–199)
HDL: 53 mg/dL (ref >39)
LDL Chol Calc (NIH): 132 mg/dL — ABNORMAL HIGH (ref 0–99)
Triglycerides: 87 mg/dL (ref 0–149)
VLDL Cholesterol Cal: 16 mg/dL (ref 5–40)

## 2020-07-30 LAB — HEMOGLOBIN A1C
Est. average glucose Bld gHb Est-mCnc: 105 mg/dL
Hgb A1c MFr Bld: 5.3 % (ref 4.8–5.6)

## 2020-09-08 ENCOUNTER — Encounter: Payer: Self-pay | Admitting: Gastroenterology

## 2020-11-05 ENCOUNTER — Ambulatory Visit (AMBULATORY_SURGERY_CENTER): Payer: Self-pay

## 2020-11-05 ENCOUNTER — Other Ambulatory Visit: Payer: Self-pay

## 2020-11-05 VITALS — Ht 65.0 in | Wt 227.0 lb

## 2020-11-05 DIAGNOSIS — Z1211 Encounter for screening for malignant neoplasm of colon: Secondary | ICD-10-CM

## 2020-11-05 DIAGNOSIS — Z01818 Encounter for other preprocedural examination: Secondary | ICD-10-CM

## 2020-11-05 DIAGNOSIS — Z800 Family history of malignant neoplasm of digestive organs: Secondary | ICD-10-CM

## 2020-11-05 MED ORDER — PLENVU 140 G PO SOLR: 1.0000 | ORAL | 0 refills | Status: DC

## 2020-11-05 NOTE — Progress Notes (Signed)
No allergies to soy or egg Pt is not on blood thinners or diet pills Denies issues with sedation/intubation Denies atrial flutter/fib  Denies constipation - goes 3 x or more per week.  Uses stool softner and eats prunes.  Emmi instructions given to pt  Pt is aware of Covid safety and care partner requirements.

## 2020-11-09 LAB — RESULTS CONSOLE HPV: CHL HPV: NEGATIVE

## 2020-11-19 LAB — HM PAP SMEAR: HM Pap smear: NORMAL

## 2020-11-29 ENCOUNTER — Other Ambulatory Visit: Payer: Self-pay | Admitting: Gastroenterology

## 2020-11-29 LAB — SARS CORONAVIRUS 2 (TAT 6-24 HRS): SARS Coronavirus 2: NEGATIVE

## 2020-12-02 ENCOUNTER — Encounter: Payer: Self-pay | Admitting: Gastroenterology

## 2020-12-02 ENCOUNTER — Other Ambulatory Visit: Payer: Self-pay

## 2020-12-02 ENCOUNTER — Ambulatory Visit (AMBULATORY_SURGERY_CENTER): Payer: BC Managed Care – PPO | Admitting: Gastroenterology

## 2020-12-02 VITALS — BP 94/58 | HR 58 | Temp 96.9°F | Resp 11 | Ht 65.0 in | Wt 227.0 lb

## 2020-12-02 DIAGNOSIS — Z1211 Encounter for screening for malignant neoplasm of colon: Secondary | ICD-10-CM

## 2020-12-02 MED ORDER — SODIUM CHLORIDE 0.9 % IV SOLN: 500.0000 mL | Freq: Once | INTRAVENOUS | Status: DC

## 2020-12-02 NOTE — Op Note (Signed)
[facility] Patient Name: Hannah Buchanan Procedure Date: 12/02/2020 11:02 AM MRN: [id number] Endoscopist: Meryl Dare , MD Age: 50 Referring MD:  Date of Birth: 05/05/1971 Gender: Female Account #: [id number] Procedure:                Colonoscopy Indications:              Screening for colorectal malignant neoplasm Medicines:                Monitored Anesthesia Care Procedure:                Pre-Anesthesia Assessment:                           - Prior to the procedure, a History and Physical                            was performed, and patient medications and                            allergies were reviewed. The patient's tolerance of                            previous anesthesia was also reviewed. The risks                            and benefits of the procedure and the sedation                            options and risks were discussed with the patient.                            All questions were answered, and informed consent                            was obtained. Prior Anticoagulants: The patient has                            taken no previous anticoagulant or antiplatelet                            agents. ASA Grade Assessment: II - A patient with                            mild systemic disease. After reviewing the risks                            and benefits, the patient was deemed in                            satisfactory condition to undergo the procedure.                           After obtaining informed consent, the colonoscope  was passed under direct vision. Throughout the                            procedure, the patient's blood pressure, pulse, and                            oxygen saturations were monitored continuously. The                            Olympus CF-HQ190L (Serial# [id number]) Colonoscope was                            introduced through the anus and advanced to the the                            cecum,  identified by appendiceal orifice and                            ileocecal valve. The ileocecal valve, appendiceal                            orifice, and rectum were photographed. The quality                            of the bowel preparation was excellent. The                            colonoscopy was performed without difficulty. The                            patient tolerated the procedure well. Scope In: 11:09:12 AM Scope Out: 11:24:06 AM Scope Withdrawal Time: 0 hours 11 minutes 53 seconds  Total Procedure Duration: 0 hours 14 minutes 54 seconds  Findings:                 The perianal and digital rectal examinations were                            normal.                           A few medium-mouthed diverticula were found in the                            left colon.                           The exam was otherwise without abnormality on                            direct and retroflexion views. Complications:            No immediate complications. Estimated blood loss:                            None. Estimated Blood Loss:  Estimated blood loss: none. Impression:               - Mild diverticulosis in the left colon.                           - The examination was otherwise normal on direct                            and retroflexion views.                           - No specimens collected. Recommendation:           - Repeat colonoscopy in 10 years for screening                            purposes.                           - Patient has a contact number available for                            emergencies. The signs and symptoms of potential                            delayed complications were discussed with the                            patient. Return to normal activities tomorrow.                            Written discharge instructions were provided to the                            patient.                           - High fiber diet.                            - Continue present medications. Meryl Dare, MD 12/02/2020 11:26:56 AM This report has been signed electronically.

## 2020-12-02 NOTE — Progress Notes (Signed)
History reviewed today  VS SB  

## 2020-12-02 NOTE — Progress Notes (Signed)
Report given to PACU, vss 

## 2020-12-02 NOTE — Patient Instructions (Signed)
Handouts given:  High fiber diet, Diverticulosis Continue current medications Start High fiber diet  YOU HAD AN ENDOSCOPIC PROCEDURE TODAY AT THE [facility]:   Refer to the procedure report that was given to you for any specific questions about what was found during the examination.  If the procedure report does not answer your questions, please call your gastroenterologist to clarify.  If you requested that your care partner not be given the details of your procedure findings, then the procedure report has been included in a sealed envelope for you to review at your convenience later.  YOU SHOULD EXPECT: Some feelings of bloating in the abdomen. Passage of more gas than usual.  Walking can help get rid of the air that was put into your GI tract during the procedure and reduce the bloating. If you had a lower endoscopy (such as a colonoscopy or flexible sigmoidoscopy) you may notice spotting of blood in your stool or on the toilet paper. If you underwent a bowel prep for your procedure, you may not have a normal bowel movement for a few days.  Please Note:  You might notice some irritation and congestion in your nose or some drainage.  This is from the oxygen used during your procedure.  There is no need for concern and it should clear up in a day or so.  SYMPTOMS TO REPORT IMMEDIATELY:   Following lower endoscopy (colonoscopy or flexible sigmoidoscopy):  Excessive amounts of blood in the stool  Significant tenderness or worsening of abdominal pains  Swelling of the abdomen that is new, acute  Fever of 100F or higher  For urgent or emergent issues, a gastroenterologist can be reached at any hour by calling [phone]. Do not use MyChart messaging for urgent concerns.   DIET:  We do recommend a small meal at first, but then you may proceed to your regular diet.  Drink plenty of fluids but you should avoid alcoholic beverages for 24 hours.  ACTIVITY:  You should plan to take  it easy for the rest of today and you should NOT DRIVE or use heavy machinery until tomorrow (because of the sedation medicines used during the test).    FOLLOW UP: Our staff will call the number listed on your records 48-72 hours following your procedure to check on you and address any questions or concerns that you may have regarding the information given to you following your procedure. If we do not reach you, we will leave a message.  We will attempt to reach you two times.  During this call, we will ask if you have developed any symptoms of COVID 19. If you develop any symptoms (ie: fever, flu-like symptoms, shortness of breath, cough etc.) before then, please call [phone].  If you test positive for Covid 19 in the 2 weeks post procedure, please call and report this information to [place].    If any biopsies were taken you will be contacted by phone or by letter within the next 1-3 weeks.  Please call us at [phone] if you have not heard about the biopsies in 3 weeks.   SIGNATURES/CONFIDENTIALITY: You and/or your care partner have signed paperwork which will be entered into your electronic medical record.  These signatures attest to the fact that that the information above on your After Visit Summary has been reviewed and is understood.  Full responsibility of the confidentiality of this discharge information lies with you and/or your care-partner.

## 2020-12-06 ENCOUNTER — Telehealth: Payer: Self-pay

## 2020-12-06 NOTE — Telephone Encounter (Signed)
LVM

## 2021-02-15 IMAGING — DX CHEST - 2 VIEW
2 series · 2 of 2 positions shown · non-contrast
Comparison: 11/12/2013

CLINICAL DATA: Shortness of breath with exertion

EXAM:
CHEST - 2 VIEW

[chest pa]
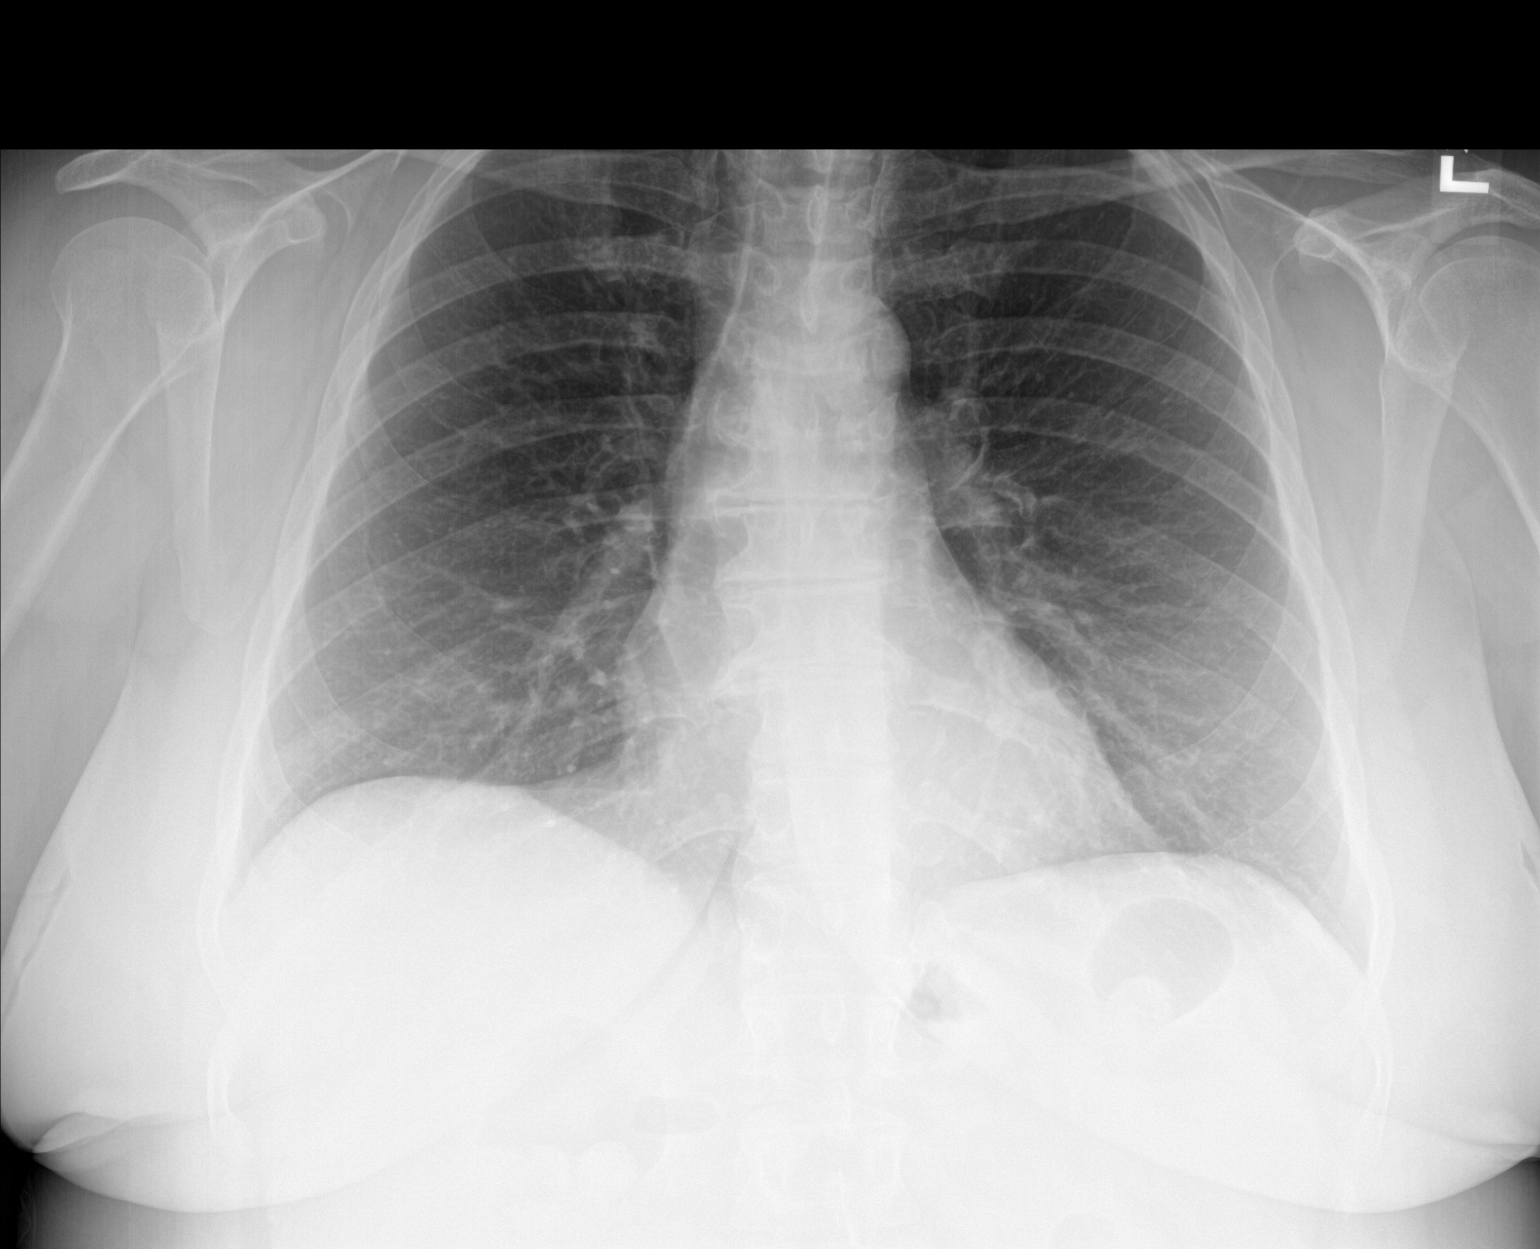

[chest lat]
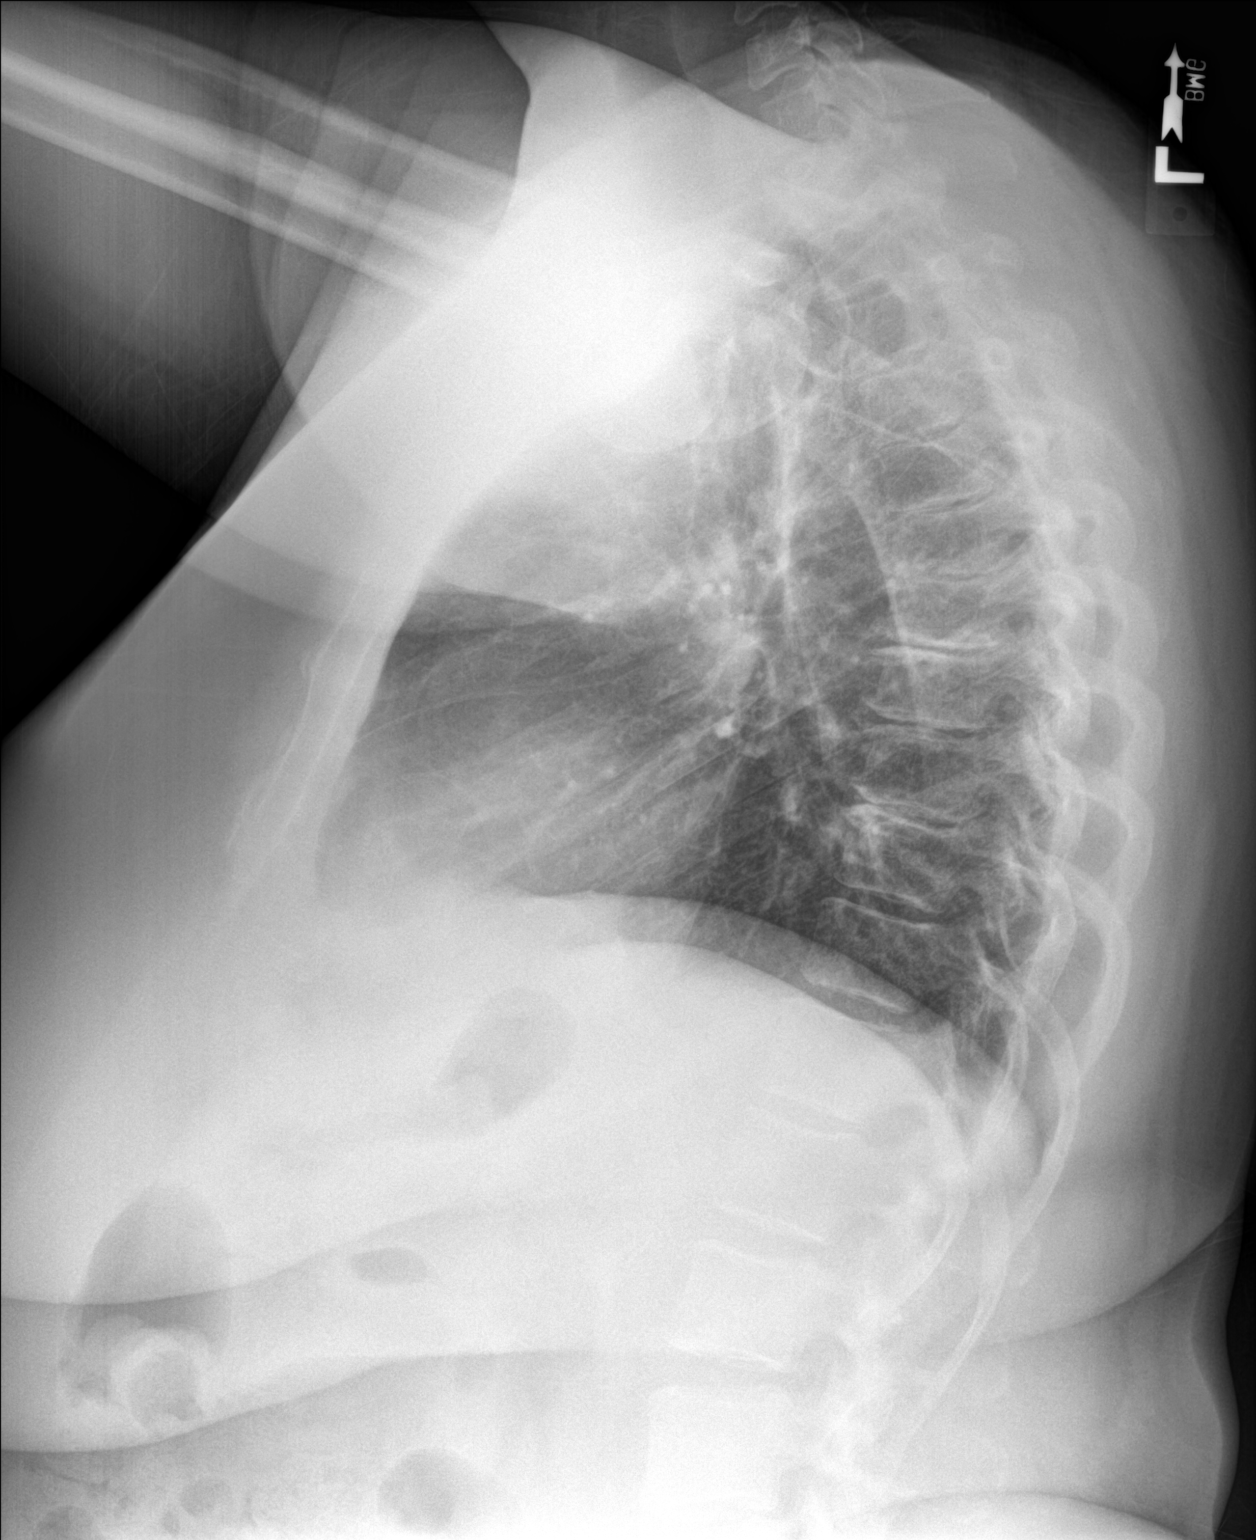

[2 of 2 positions shown; findings below may reference images not displayed]

FINDINGS: The heart size and mediastinal contours are within normal limits.
Both lungs are clear. The visualized skeletal structures are
unremarkable.
IMPRESSION: No active cardiopulmonary disease.

## 2021-06-08 NOTE — Progress Notes (Signed)
Subjective:  Patient ID: Hannah Buchanan, female    DOB: 1971/09/06  Age: 50 y.o. MRN: [id number]  CC:  Chief Complaint  Patient presents with   Joint Swelling    Pt reports has had history of swelling in the ankles a few years ago, noted started having swelling again apx 1 month ago, pt notes no pattern other than worse after being on feet all day, notes some swelling today still from yesterday, pt has tried elevation, decreased salt and increased water but will still notice swelling     HPI Hannah Buchanan presents for   Hypertension: Last visit with me in 07/2020. On Toprol 37. 52m qd at that time. Pedal edema noted prior. Met with cardiology  in 2020 - reassuring BNP and cardiology eval. Slat intake and compression stockings discussed. Swelling improved with weight loss prior.  Noted return past month or so. Change in diet, food choices. Has tried cutting back on salt  - no changes. No compression stockings.  No dyspnea, chest pain.  Weight up 10# from 12/02/20.  Some fatigue - feels like needs to sleep more past few months.  Home readings:117/70.  No PND/orthopnea.   BP Readings from Last 3 Encounters:  06/09/21 134/78  12/02/20 (!) 94/58  07/26/20 123/75   Wt Readings from Last 3 Encounters:  06/09/21 237 lb 12.8 oz (107.9 kg)  12/02/20 227 lb (103 kg)  11/05/20 227 lb (103 kg)   Lab Results  Component Value Date   CREATININE 0.79 06/09/2021   Hyperlipidemia: No current statin.  The 10-year ASCVD risk score (Mikey BussingDC Jr., et al., 2013) is: 1.8%   Values used to calculate the score:     Age: [age]years     Sex: Female     Is Non-Hispanic African American: No     Diabetic: No     Tobacco smoker: No     Systolic Blood Pressure: 1588mmHg     Is BP treated: Yes     HDL Cholesterol: 56.1 mg/dL     Total Cholesterol: 185 mg/dL  Lab Results  Component Value Date   CHOL 185 06/09/2021   HDL 56.10 06/09/2021   LDLCALC 111 (H) 06/09/2021   TRIG 85.0 06/09/2021   CHOLHDL  3 06/09/2021   Lab Results  Component Value Date   ALT 22 06/09/2021   AST 18 06/09/2021   ALKPHOS 73 06/09/2021   BILITOT 0.6 06/09/2021     History Patient Active Problem List   Diagnosis Date Noted   Allergic rhinitis due to pollen 11/18/2014   Essential hypertension, benign 11/18/2014   GERD 11/08/2009   OTHER DYSPHAGIA 11/08/2009   FLATULENCE ERUCTATION AND GAS PAIN 11/08/2009   Past Medical History:  Diagnosis Date   Allergy    Claritin daily yearround.   Arthritis    per pt   Depression    Esophageal stricture    GERD (gastroesophageal reflux disease)    Hiatal hernia    Hypertension    Pneumonia    Recurrent cold sores    Status post dilation of esophageal narrowing    Past Surgical History:  Procedure Laterality Date   Admission  10/09/1998   prolonged syncopal event; admission for 24 hours.  [facility]  BLADDER REPAIR  1998   [facility]/Gynecology.   BREAST SURGERY  1999   breast reduction   KNEE ARTHROSCOPY Right    10 yrs ago   UPPER GASTROINTESTINAL ENDOSCOPY  2018  Allergies  Allergen Reactions   Gluten Meal Other (See Comments)   Wheat Bran    Prior to Admission medications   Medication Sig Start Date End Date Taking? Authorizing Provider  Cholecalciferol (VITAMIN D-3 PO) Vitamin D3    [provider]  docusate sodium (COLACE) 100 MG capsule Take 100 mg by mouth as needed.     [provider]  loratadine (CLARITIN) 10 MG tablet Take 10 mg by mouth daily.    [provider]  meloxicam (MOBIC) 15 MG tablet 7.5 mg as needed.  05/08/18   [provider]  metoprolol succinate (TOPROL-XL) 25 MG 24 hr tablet TAKE 1 AND 1/2 TABLET BY MOUTH ONCE DAILY 07/26/20   Wendie Agreste, MD  Misc Natural Products (NEURIVA PO) Neuriva Original    [provider]  Omega-3 Fatty Acids (FISH OIL PO) Fish Oil 1,200 mg (144 mg-216 mg) capsule    [provider]  sertraline (ZOLOFT) 50 MG tablet Take 50 mg by  mouth daily. 11/09/20   [provider]  valACYclovir (VALTREX) 500 MG tablet Take 500 mg by mouth as needed.    [provider]   Social History   Socioeconomic History   Marital status: Married    Spouse name: Not on file   Number of children: 2   Years of education: Not on file   Highest education level: Not on file  Occupational History   Occupation: medical assistant    Employer: [employer]  Tobacco Use   Smoking status: Never   Smokeless tobacco: Never  Vaping Use   Vaping Use: Never used  Substance and Sexual Activity   Alcohol use: Yes    Comment: occasional   Drug use: No   Sexual activity: Yes  Other Topics Concern   Not on file  Social History Narrative   Marital status: married x 26 years; happily      Chldren: 2 children (58, 25); no grandchldren.      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   Social Determinants of Health   Financial Resource Strain: Not on file  Food Insecurity: Not on file  Transportation Needs: Not on file  Physical Activity: Not on file  Stress: Not on file  Social Connections: Not on file  Intimate Partner Violence: Not on file    Review of Systems  Constitutional:  Negative for fatigue and unexpected weight change.  Respiratory:  Negative for chest tightness and shortness of breath.   Cardiovascular:  Positive for leg swelling. Negative for chest pain and palpitations.  Gastrointestinal:  Negative for abdominal pain and blood in stool.  Neurological:  Negative for dizziness, syncope, light-headedness and headaches.    Objective:   Vitals:   06/09/21 1151  BP: 134/78  Pulse: 63  Resp: 16  Temp: 98.2 F (36.8 C)  TempSrc: Temporal  SpO2: 98%  Weight: 237 lb 12.8 oz (107.9 kg)  Height: '5\' 5"'   (1.651 m)     Physical Exam Vitals reviewed.  Constitutional:      Appearance: Normal appearance. She is well-developed.  HENT:     Head: Normocephalic and atraumatic.  Eyes:  Conjunctiva/sclera: Conjunctivae normal.     Pupils: Pupils are equal, round, and reactive to light.  Neck:     Vascular: No carotid bruit.  Cardiovascular:     Rate and Rhythm: Normal rate and regular rhythm.     Heart sounds: Normal heart sounds.  Pulmonary:     Effort: Pulmonary effort is normal.     Breath sounds: Normal breath sounds.  Abdominal:     Palpations: Abdomen is soft. There is no pulsatile mass.     Tenderness: There is no abdominal tenderness.  Musculoskeletal:     Right lower leg: Edema (Trace to 1+ bilateral lower extremities to approximately mid tibia.  No stasis changes/wounds.) present.     Left lower leg: Edema present.  Skin:    General: Skin is warm and dry.  Neurological:     Mental Status: She is alert and oriented to person, place, and time.  Psychiatric:        Mood and Affect: Mood normal.        Behavior: Behavior normal.       Assessment & Plan:  Hannah Buchanan is a 50 y.o. female . Essential hypertension - Plan: CBC, Comprehensive metabolic panel  -No med changes at this time, but consider addition of low-dose chlorthalidone depending on labs and persistence of edema.  Peripheral edema - Plan: Comprehensive metabolic panel  -Previously improved with weight loss, has had some weight gain.  Treatment with compression stockings discussed.  No other apparent signs of fluid overload, CHF.  Check labs as above, RTC precautions.  Handout given.  Fatigue, unspecified type - Plan: CBC, Comprehensive metabolic panel, TSH  -As above check labs.  Plan for follow-up in 1 month unless fatigue worsens, new symptoms sooner.  Hyperlipidemia, unspecified hyperlipidemia type - Plan: Comprehensive metabolic panel, Lipid panel  -Check labs, no current statin.  No orders of the  defined types were placed in this encounter.  Patient Instructions  I will check some labs. Weight change may be cause of swelling again. Compression stockings may help if those are tolerated.  Adding chlorthalidone may be an option but I would like to look at labs first.  Recheck in next month.   If concerns on labs I will let you know. If fatigue continues please follow up to discuss further. Keep me posted and thanks for coming in today.   Peripheral Edema Peripheral edema is swelling that is caused by a buildup of fluid. Peripheral edema most often affects the lower legs, ankles, and feet. It can also develop in the arms, hands, and face. The area of the body that has peripheral edema will look swollen. It may also feel heavy or warm. Your clothes may start to feel tight. Pressing on the area may make a temporary dent in your skin. You may not be able to move your swollen arm or leg as much as usual. There are many causes of peripheral edema. It can happen because of a complication of other conditions such as congestive heart failure, kidney disease, or a problem with your blood circulation. It also can be a side effect of certain medicines or because of an infection. It often happens to women during pregnancy. Sometimes, the cause is not known. Follow these instructions at home: Managing pain, stiffness, and swelling  Raise (elevate) your legs while you are sitting or lying down. Move around often to prevent stiffness and to lessen swelling. Do not sit or stand for long periods of time. Wear  support stockings as told by your health care provider. Medicines Take over-the-counter and prescription medicines only as told by your health care provider. Your health care provider may prescribe medicine to help your body get rid of excess water (diuretic). General instructions Pay attention to any changes in your symptoms. Follow instructions from your health care provider about limiting salt  (sodium) in your diet. Sometimes, eating less salt may reduce swelling. Moisturize skin daily to help prevent skin from cracking and draining. Keep all follow-up visits as told by your health care provider. This is important. Contact a health care provider if you have: A fever. Edema that starts suddenly or is getting worse, especially if you are pregnant or have a medical condition. Swelling in only one leg. Increased swelling, redness, or pain in one or both of your legs. Drainage or sores at the area where you have edema. Get help right away if you: Develop shortness of breath, especially when you are lying down. Have pain in your chest or abdomen. Feel weak. Feel faint. Summary Peripheral edema is swelling that is caused by a buildup of fluid. Peripheral edema most often affects the lower legs, ankles, and feet. Move around often to prevent stiffness and to lessen swelling. Do not sit or stand for long periods of time. Pay attention to any changes in your symptoms. Contact a health care provider if you have edema that starts suddenly or is getting worse, especially if you are pregnant or have a medical condition. Get help right away if you develop shortness of breath, especially when lying down. This information is not intended to replace advice given to you by your health care provider. Make sure you discuss any questions you have with your health care provider. Document Revised: 06/19/2018 Document Reviewed: 06/19/2018 Elsevier Patient Education  2022 [place].   Fatigue If you have fatigue, you feel tired all the time and have a lack of energy or a lack of motivation. Fatigue may make it difficult to start or complete tasks because of exhaustion. In general, occasional or mild fatigue is often a normal response to activity or life. However, long-lasting (chronic) or extreme fatigue may be a symptom of a medical condition. Follow these instructions at home: General  instructions Watch your fatigue for any changes. Go to bed and get up at the same time every day. Avoid fatigue by pacing yourself during the day and getting enough sleep at night. Maintain a healthy weight. Medicines Take over-the-counter and prescription medicines only as told by your health care provider. Take a multivitamin, if told by your health care provider.  Do not use herbal or dietary supplements unless they are approved by your health care provider. Activity  Exercise regularly, as told by your health care provider. Use or practice techniques to help you relax, such as yoga, tai chi, meditation, or massage therapy. Eating and drinking  Avoid heavy meals in the evening. Eat a well-balanced diet, which includes lean proteins, whole grains, plenty of fruits and vegetables, and low-fat dairy products. Avoid consuming too much caffeine. Avoid the use of alcohol. Drink enough fluid to keep your urine pale yellow. Lifestyle Change situations that cause you stress. Try to keep your work and personal schedule in balance. Do not use any products that contain nicotine or tobacco, such as cigarettes and e-cigarettes. If you need help quitting, ask your health care provider. Do not use drugs. Contact a health care provider if: Your fatigue does not get better. You have  a fever. You suddenly lose or gain weight. You have headaches. You have trouble falling asleep or sleeping through the night. You feel angry, guilty, anxious, or sad. You are unable to have a bowel movement (constipation). Your skin is dry. You have swelling in your legs or another part of your body. Get help right away if: You feel confused. Your vision is blurry. You feel faint or you pass out. You have a severe headache. You have severe pain in your abdomen, your back, or the area between your waist and hips (pelvis). You have chest pain, shortness of breath, or an irregular or fast heartbeat. You are unable  to urinate, or you urinate less than normal. You have abnormal bleeding, such as bleeding from the rectum, vagina, nose, lungs, or nipples. You vomit blood. You have thoughts about hurting yourself or others. If you ever feel like you may hurt yourself or others, or have thoughts about taking your own life, get help right away. You can go to your nearest emergency department or call: Your local emergency services (911 in the [place] A suicide crisis helpline, such as the [place] at 404-503-5148. This is open 24 hours a day. Summary If you have fatigue, you feel tired all the time and have a lack of energy or a lack of motivation. Fatigue may make it difficult to start or complete tasks because of exhaustion. Long-lasting (chronic) or extreme fatigue may be a symptom of a medical condition. Exercise regularly, as told by your health care provider. Change situations that cause you stress. Try to keep your work and personal schedule in balance. This information is not intended to replace advice given to you by your health care provider. Make sure you discuss any questions you have with your health care provider. Document Revised: 08/05/2020 Document Reviewed: 08/05/2020 Elsevier Patient Education  2022 [place],   Merri Ray, MD Ali Chukson, [facility] 06/10/21 11:33 AM

## 2021-06-09 ENCOUNTER — Ambulatory Visit: Payer: BC Managed Care – PPO | Admitting: Family Medicine

## 2021-06-09 ENCOUNTER — Other Ambulatory Visit: Payer: Self-pay

## 2021-06-09 VITALS — BP 134/78 | HR 63 | Temp 98.2°F | Resp 16 | Ht 65.0 in | Wt 237.8 lb

## 2021-06-09 DIAGNOSIS — R5383 Other fatigue: Secondary | ICD-10-CM

## 2021-06-09 DIAGNOSIS — E785 Hyperlipidemia, unspecified: Secondary | ICD-10-CM | POA: Diagnosis not present

## 2021-06-09 DIAGNOSIS — I10 Essential (primary) hypertension: Secondary | ICD-10-CM

## 2021-06-09 DIAGNOSIS — R609 Edema, unspecified: Secondary | ICD-10-CM

## 2021-06-09 LAB — CBC
HCT: 38.7 % (ref 36.0–46.0)
Hemoglobin: 13.2 g/dL (ref 12.0–15.0)
MCHC: 34.1 g/dL (ref 30.0–36.0)
MCV: 90.7 fl (ref 78.0–100.0)
Platelets: 169 10*3/uL (ref 150.0–400.0)
RBC: 4.27 Mil/uL (ref 3.87–5.11)
RDW: 13.5 % (ref 11.5–15.5)
WBC: 5.5 10*3/uL (ref 4.0–10.5)

## 2021-06-09 LAB — TSH: TSH: 1.61 u[IU]/mL (ref 0.35–5.50)

## 2021-06-09 LAB — COMPREHENSIVE METABOLIC PANEL
ALT: 22 U/L (ref 0–35)
AST: 18 U/L (ref 0–37)
Albumin: 4.3 g/dL (ref 3.5–5.2)
Alkaline Phosphatase: 73 U/L (ref 39–117)
BUN: 19 mg/dL (ref 6–23)
CO2: 29 mEq/L (ref 19–32)
Calcium: 9.3 mg/dL (ref 8.4–10.5)
Chloride: 104 mEq/L (ref 96–112)
Creatinine, Ser: 0.79 mg/dL (ref 0.40–1.20)
GFR: 86.85 mL/min (ref >60.00)
Glucose, Bld: 92 mg/dL (ref 70–99)
Potassium: 4.5 mEq/L (ref 3.5–5.1)
Sodium: 139 mEq/L (ref 135–145)
Total Bilirubin: 0.6 mg/dL (ref 0.2–1.2)
Total Protein: 6.7 g/dL (ref 6.0–8.3)

## 2021-06-09 LAB — LIPID PANEL
Cholesterol: 185 mg/dL (ref 0–200)
HDL: 56.1 mg/dL (ref >39.00)
LDL Cholesterol: 111 mg/dL — ABNORMAL HIGH (ref 0–99)
NonHDL: 128.48
Total CHOL/HDL Ratio: 3
Triglycerides: 85 mg/dL (ref 0.0–149.0)
VLDL: 17 mg/dL (ref 0.0–40.0)

## 2021-06-09 NOTE — Patient Instructions (Addendum)
I will check some labs. Weight change may be cause of swelling again. Compression stockings may help if those are tolerated.  Adding chlorthalidone may be an option but I would like to look at labs first.  Recheck in next month.   If concerns on labs I will let you know. If fatigue continues please follow up to discuss further. Keep me posted and thanks for coming in today.   Peripheral Edema Peripheral edema is swelling that is caused by a buildup of fluid. Peripheral edema most often affects the lower legs, ankles, and feet. It can also develop in the arms, hands, and face. The area of the body that has peripheral edema will look swollen. It may also feel heavy or warm. Your clothes may start to feel tight. Pressing on the area may make a temporary dent in your skin. You may not be able to move your swollen arm or leg as much as usual. There are many causes of peripheral edema. It can happen because of a complication of other conditions such as congestive heart failure, kidney disease, or a problem with your blood circulation. It also can be a side effect of certain medicines or because of an infection. It often happens to women during pregnancy. Sometimes, the cause is not known. Follow these instructions at home: Managing pain, stiffness, and swelling  Raise (elevate) your legs while you are sitting or lying down. Move around often to prevent stiffness and to lessen swelling. Do not sit or stand for long periods of time. Wear support stockings as told by your health care provider. Medicines Take over-the-counter and prescription medicines only as told by your health care provider. Your health care provider may prescribe medicine to help your body get rid of excess water (diuretic). General instructions Pay attention to any changes in your symptoms. Follow instructions from your health care provider about limiting salt (sodium) in your diet. Sometimes, eating less salt may reduce  swelling. Moisturize skin daily to help prevent skin from cracking and draining. Keep all follow-up visits as told by your health care provider. This is important. Contact a health care provider if you have: A fever. Edema that starts suddenly or is getting worse, especially if you are pregnant or have a medical condition. Swelling in only one leg. Increased swelling, redness, or pain in one or both of your legs. Drainage or sores at the area where you have edema. Get help right away if you: Develop shortness of breath, especially when you are lying down. Have pain in your chest or abdomen. Feel weak. Feel faint. Summary Peripheral edema is swelling that is caused by a buildup of fluid. Peripheral edema most often affects the lower legs, ankles, and feet. Move around often to prevent stiffness and to lessen swelling. Do not sit or stand for long periods of time. Pay attention to any changes in your symptoms. Contact a health care provider if you have edema that starts suddenly or is getting worse, especially if you are pregnant or have a medical condition. Get help right away if you develop shortness of breath, especially when lying down. This information is not intended to replace advice given to you by your health care provider. Make sure you discuss any questions you have with your health care provider. Document Revised: 06/19/2018 Document Reviewed: 06/19/2018 Elsevier Patient Education  2022 [place].   Fatigue If you have fatigue, you feel tired all the time and have a lack of energy or a lack of  motivation. Fatigue may make it difficult to start or complete tasks because of exhaustion. In general, occasional or mild fatigue is often a normal response to activity or life. However, long-lasting (chronic) or extreme fatigue may be a symptom of a medical condition. Follow these instructions at home: General instructions Watch your fatigue for any changes. Go to bed and get up at  the same time every day. Avoid fatigue by pacing yourself during the day and getting enough sleep at night. Maintain a healthy weight. Medicines Take over-the-counter and prescription medicines only as told by your health care provider. Take a multivitamin, if told by your health care provider.  Do not use herbal or dietary supplements unless they are approved by your health care provider. Activity  Exercise regularly, as told by your health care provider. Use or practice techniques to help you relax, such as yoga, tai chi, meditation, or massage therapy. Eating and drinking  Avoid heavy meals in the evening. Eat a well-balanced diet, which includes lean proteins, whole grains, plenty of fruits and vegetables, and low-fat dairy products. Avoid consuming too much caffeine. Avoid the use of alcohol. Drink enough fluid to keep your urine pale yellow. Lifestyle Change situations that cause you stress. Try to keep your work and personal schedule in balance. Do not use any products that contain nicotine or tobacco, such as cigarettes and e-cigarettes. If you need help quitting, ask your health care provider. Do not use drugs. Contact a health care provider if: Your fatigue does not get better. You have a fever. You suddenly lose or gain weight. You have headaches. You have trouble falling asleep or sleeping through the night. You feel angry, guilty, anxious, or sad. You are unable to have a bowel movement (constipation). Your skin is dry. You have swelling in your legs or another part of your body. Get help right away if: You feel confused. Your vision is blurry. You feel faint or you pass out. You have a severe headache. You have severe pain in your abdomen, your back, or the area between your waist and hips (pelvis). You have chest pain, shortness of breath, or an irregular or fast heartbeat. You are unable to urinate, or you urinate less than normal. You have abnormal bleeding,  such as bleeding from the rectum, vagina, nose, lungs, or nipples. You vomit blood. You have thoughts about hurting yourself or others. If you ever feel like you may hurt yourself or others, or have thoughts about taking your own life, get help right away. You can go to your nearest emergency department or call: Your local emergency services (911 in the [place] A suicide crisis helpline, such as the [place] at 781-733-6790. This is open 24 hours a day. Summary If you have fatigue, you feel tired all the time and have a lack of energy or a lack of motivation. Fatigue may make it difficult to start or complete tasks because of exhaustion. Long-lasting (chronic) or extreme fatigue may be a symptom of a medical condition. Exercise regularly, as told by your health care provider. Change situations that cause you stress. Try to keep your work and personal schedule in balance. This information is not intended to replace advice given to you by your health care provider. Make sure you discuss any questions you have with your health care provider. Document Revised: 08/05/2020 Document Reviewed: 08/05/2020 Elsevier Patient Education  2022 [employer].

## 2021-06-10 ENCOUNTER — Encounter: Payer: Self-pay | Admitting: Family Medicine

## 2021-07-15 ENCOUNTER — Ambulatory Visit: Payer: BC Managed Care – PPO | Admitting: Family Medicine

## 2021-07-15 ENCOUNTER — Encounter: Payer: Self-pay | Admitting: Family Medicine

## 2021-07-15 ENCOUNTER — Other Ambulatory Visit: Payer: Self-pay

## 2021-07-15 VITALS — BP 128/74 | HR 65 | Temp 98.0°F | Resp 16 | Ht 65.0 in | Wt 236.0 lb

## 2021-07-15 DIAGNOSIS — R5383 Other fatigue: Secondary | ICD-10-CM

## 2021-07-15 DIAGNOSIS — I10 Essential (primary) hypertension: Secondary | ICD-10-CM

## 2021-07-15 DIAGNOSIS — R400 Somnolence: Secondary | ICD-10-CM

## 2021-07-15 DIAGNOSIS — R600 Localized edema: Secondary | ICD-10-CM

## 2021-07-15 DIAGNOSIS — R0683 Snoring: Secondary | ICD-10-CM

## 2021-07-15 MED ORDER — CHLORTHALIDONE 25 MG PO TABS: 12.5000 mg | ORAL_TABLET | Freq: Every day | ORAL | 1 refills | Status: DC

## 2021-07-15 MED ORDER — METOPROLOL SUCCINATE ER 25 MG PO TB24: ORAL_TABLET | ORAL | 3 refills | Status: DC

## 2021-07-15 NOTE — Progress Notes (Signed)
Subjective:  Patient ID: Hannah Buchanan, female    DOB: 1971/05/29  Age: 50 y.o. MRN: [id number]  CC:  Chief Complaint  Patient presents with   Edema    Pt reports swelling in legs apx the same has been wearing the compression stockings no real change    Fatigue    Pt reports fatigue about the same no improvement at this time     HPI Hannah ADAIJAH Buchanan presents for   Fatigue, pedal edema Discussed at September 1 visit. Previously had been evaluated for pedal edema including meeting with cardiology, reassuring BNP and cardiology eval prior, salt intake, compression stockings were discussed and swelling improved with weight loss prior.  She had tried cutting back on salt recently with recurrence of her swelling.  Weight was up 10 pounds from February.  Some increased fatigue past few months.  Blood pressure was stable.  Recommended restart of compression stockings, and work on weight loss.  TSH, CBC, CMP overall reassuring. Cut out salt, wearing compression stockings. Weight down 1#. Swelling about the same. No chest pains/dyspnea. Some swelling in am, some improvement overnight.   Still fatigued. Sleeping more - now 8-9 hours (prior 6-7). Still feels tired during day.  Some snoring. Unknown if pauses. Wakes up once per night to urinate. No PND, no dyspnea. No prior sleep apnea testing.   Taking toprol 37.5 with prior palpitations and HTN. BP 120/68. 117/60 range.  Has taken low dose chlorthalidone.  Some chronic constipation. Prunes stool softeners to manage.  Wt Readings from Last 3 Encounters:  07/15/21 236 lb (107 kg)  06/09/21 237 lb 12.8 oz (107.9 kg)  12/02/20 227 lb (103 kg)     Results for orders placed or performed in visit on 06/09/21  CBC  Result Value Ref Range   WBC 5.5 4.0 - 10.5 K/uL   RBC 4.27 3.87 - 5.11 Mil/uL   Platelets 169.0 150.0 - 400.0 K/uL   Hemoglobin 13.2 12.0 - 15.0 g/dL   HCT 96.7 89.3 - 81.0 %   MCV 90.7 78.0 - 100.0 fl   MCHC 34.1 30.0 - 36.0 g/dL    RDW 17.5 10.2 - 58.5 %  Comprehensive metabolic panel  Result Value Ref Range   Sodium 139 135 - 145 mEq/L   Potassium 4.5 3.5 - 5.1 mEq/L   Chloride 104 96 - 112 mEq/L   CO2 29 19 - 32 mEq/L   Glucose, Bld 92 70 - 99 mg/dL   BUN 19 6 - 23 mg/dL   Creatinine, Ser 2.77 0.40 - 1.20 mg/dL   Total Bilirubin 0.6 0.2 - 1.2 mg/dL   Alkaline Phosphatase 73 39 - 117 U/L   AST 18 0 - 37 U/L   ALT 22 0 - 35 U/L   Total Protein 6.7 6.0 - 8.3 g/dL   Albumin 4.3 3.5 - 5.2 g/dL   GFR 82.42 >35.36 mL/min   Calcium 9.3 8.4 - 10.5 mg/dL  Lipid panel  Result Value Ref Range   Cholesterol 185 0 - 200 mg/dL   Triglycerides 14.4 0.0 - 149.0 mg/dL   HDL 31.54 >00.86 mg/dL   VLDL 76.1 0.0 - 95.0 mg/dL   LDL Cholesterol 932 (H) 0 - 99 mg/dL   Total CHOL/HDL Ratio 3    NonHDL 128.48   TSH  Result Value Ref Range   TSH 1.61 0.35 - 5.50 uIU/mL       History Patient Active Problem List   Diagnosis Date Noted  Allergic rhinitis due to pollen 11/18/2014   Essential hypertension, benign 11/18/2014   GERD 11/08/2009   OTHER DYSPHAGIA 11/08/2009   FLATULENCE ERUCTATION AND GAS PAIN 11/08/2009   Past Medical History:  Diagnosis Date   Allergy    Claritin daily yearround.   Arthritis    per pt   Depression    Esophageal stricture    GERD (gastroesophageal reflux disease)    Hiatal hernia    Hypertension    Pneumonia    Recurrent cold sores    Status post dilation of esophageal narrowing    Past Surgical History:  Procedure Laterality Date   Admission  10/09/1998   prolonged syncopal event; admission for 24 hours.  [facility]   BLADDER REPAIR  1998   [facility] Lowe/Gynecology.   BREAST SURGERY  1999   breast reduction   KNEE ARTHROSCOPY Right    10 yrs ago   UPPER GASTROINTESTINAL ENDOSCOPY  2018   Allergies  Allergen Reactions   Gluten Meal Other (See Comments)   Wheat Bran    Prior to Admission medications   Medication Sig Start Date End Date Taking? Authorizing Provider   Cholecalciferol (VITAMIN D-3 PO) Vitamin D3   Yes [provider]  docusate sodium (COLACE) 100 MG capsule Take 100 mg by mouth as needed.    Yes [provider]  loratadine (CLARITIN) 10 MG tablet Take 10 mg by mouth daily.   Yes [provider]  meloxicam (MOBIC) 15 MG tablet 7.5 mg as needed.  05/08/18  Yes [provider]  metoprolol succinate (TOPROL-XL) 25 MG 24 hr tablet TAKE 1 AND 1/2 TABLET BY MOUTH ONCE DAILY 07/26/20  Yes Shade Flood, MD  Misc Natural Products (NEURIVA PO) Neuriva Original   Yes [provider]  Omega-3 Fatty Acids (FISH OIL PO) Fish Oil 1,200 mg (144 mg-216 mg) capsule   Yes [provider]  sertraline (ZOLOFT) 50 MG tablet Take 50 mg by mouth daily. 11/09/20  Yes [provider]  valACYclovir (VALTREX) 500 MG tablet Take 500 mg by mouth as needed.   Yes [provider]   Social History   Socioeconomic History   Marital status: Married    Spouse name: Not on file   Number of children: 2   Years of education: Not on file   Highest education level: Not on file  Occupational History   Occupation: medical assistant    Employer: [employer]  Tobacco Use   Smoking status: Never   Smokeless tobacco: Never  Vaping Use   Vaping Use: Never used  Substance and Sexual Activity   Alcohol use: Yes    Comment: occasional   Drug use: No   Sexual activity: Yes  Other Topics Concern   Not on file  Social History Narrative   Marital status: married x 26 years; happily      Chldren: 2 children (22, 25); no grandchldren.      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   Social Determinants of Health   Financial Resource Strain: Not on file  Food Insecurity: Not on file  Transportation  Needs: Not on file  Physical Activity: Not on  file  Stress: Not on file  Social Connections: Not on file  Intimate Partner Violence: Not on file    Review of Systems   Objective:   Vitals:   07/15/21 1051  BP: 128/74  Pulse: 65  Resp: 16  Temp: 98 F (36.7 C)  TempSrc: Temporal  SpO2: 94%  Weight: 236 lb (107 kg)  Height: 5\' 5"  (1.651 m)     Physical Exam Vitals reviewed.  Constitutional:      Appearance: Normal appearance. She is well-developed.  HENT:     Head: Normocephalic and atraumatic.  Eyes:     Conjunctiva/sclera: Conjunctivae normal.     Pupils: Pupils are equal, round, and reactive to light.  Neck:     Vascular: No carotid bruit.  Cardiovascular:     Rate and Rhythm: Normal rate and regular rhythm.     Heart sounds: Normal heart sounds.    No gallop.  Pulmonary:     Effort: Pulmonary effort is normal. No respiratory distress.     Breath sounds: Normal breath sounds. No wheezing, rhonchi or rales.  Abdominal:     Palpations: Abdomen is soft. There is no pulsatile mass.     Tenderness: There is no abdominal tenderness.  Musculoskeletal:     Right lower leg: Edema (1+ lower 1/2 le bilat.) present.     Left lower leg: Edema present.  Skin:    General: Skin is warm and dry.  Neurological:     Mental Status: She is alert and oriented to person, place, and time.  Psychiatric:        Mood and Affect: Mood normal.        Behavior: Behavior normal.       Assessment & Plan:  JORDANNE ELSBURY is a 50 y.o. female . Fatigue, unspecified type - Plan: Ambulatory referral to Sleep Studies Daytime somnolence - Plan: Ambulatory referral to Sleep Studies Snoring - Plan: Ambulatory referral to Sleep Studies  -Persistent fatigue, possible underlying obstructive sleep apnea based on symptoms above, refer to sleep specialist.  Previous labs reassuring.  Essential hypertension - Plan: metoprolol succinate (TOPROL-XL) 25 MG 24 hr tablet, chlorthalidone (HYGROTON) 25  MG tablet  -Stable, but with edema will lower dose of Toprol, try low-dose chlorthalidone, close monitoring of blood pressure.  RTC precautions.  MyChart update next 2 weeks.  Pedal edema - Plan: chlorthalidone (HYGROTON) 25 MG tablet  -Continue compression stockings, add low-dose chlorthalidone.  Update as above next few weeks.  RTC precautions.  Meds ordered this encounter  Medications   metoprolol succinate (TOPROL-XL) 25 MG 24 hr tablet    Sig: TAKE 1 TABLET BY MOUTH ONCE DAILY    Dispense:  90 tablet    Refill:  3   chlorthalidone (HYGROTON) 25 MG tablet    Sig: Take 0.5 tablets (12.5 mg total) by mouth daily.    Dispense:  45 tablet    Refill:  1   Patient Instructions  Keep up the good work with diet. Continue compression stockings.  I will refer you to sleep specialist.  Can try low dose chlorthalidone to see if that helps swelling and change Toprol to 25 per day. If any low blood pressures, return to just toprol.  Send me an update in next few weeks.   Here is number for weight management if needed.  [facility]  [phone]  Return to the clinic or go to the nearest emergency room if any of your  symptoms worsen or new symptoms occur.     Signed,   Meredith Staggers, MD [facility], [facility] 07/15/21 1:59 PM

## 2021-07-15 NOTE — Patient Instructions (Addendum)
Keep up the good work with diet. Continue compression stockings.  I will refer you to sleep specialist.  Can try low dose chlorthalidone to see if that helps swelling and change Toprol to 25 per day. If any low blood pressures, return to just toprol.  Send me an update in next few weeks.   Here is number for weight management if needed.  [facility]  [phone]  Return to the clinic or go to the nearest emergency room if any of your symptoms worsen or new symptoms occur.

## 2021-07-29 ENCOUNTER — Other Ambulatory Visit: Payer: Self-pay | Admitting: Family Medicine

## 2021-07-29 DIAGNOSIS — I10 Essential (primary) hypertension: Secondary | ICD-10-CM

## 2021-08-16 ENCOUNTER — Encounter: Payer: Self-pay | Admitting: Family Medicine

## 2021-09-15 ENCOUNTER — Ambulatory Visit: Payer: BC Managed Care – PPO | Admitting: Family Medicine

## 2021-09-28 ENCOUNTER — Ambulatory Visit: Payer: BC Managed Care – PPO | Admitting: Family Medicine

## 2021-09-29 ENCOUNTER — Institutional Professional Consult (permissible substitution): Payer: BC Managed Care – PPO | Admitting: Neurology

## 2021-10-31 ENCOUNTER — Encounter: Payer: Self-pay | Admitting: Family Medicine

## 2021-10-31 ENCOUNTER — Ambulatory Visit: Payer: BC Managed Care – PPO | Admitting: Family Medicine

## 2021-10-31 VITALS — BP 102/62 | HR 65 | Temp 98.1°F | Ht 65.0 in | Wt 237.0 lb

## 2021-10-31 DIAGNOSIS — R5383 Other fatigue: Secondary | ICD-10-CM | POA: Diagnosis not present

## 2021-10-31 DIAGNOSIS — I10 Essential (primary) hypertension: Secondary | ICD-10-CM | POA: Diagnosis not present

## 2021-10-31 DIAGNOSIS — R600 Localized edema: Secondary | ICD-10-CM | POA: Diagnosis not present

## 2021-10-31 DIAGNOSIS — R400 Somnolence: Secondary | ICD-10-CM | POA: Diagnosis not present

## 2021-10-31 MED ORDER — METOPROLOL SUCCINATE ER 25 MG PO TB24: 37.5000 mg | ORAL_TABLET | Freq: Every day | ORAL | 2 refills | Status: DC

## 2021-10-31 NOTE — Progress Notes (Signed)
Subjective:  Patient ID: Hannah Buchanan, female    DOB: 1971-05-24  Age: 51 y.o. MRN: [id number]  CC:  Chief Complaint  Patient presents with   Follow-up    HPI Hannah Buchanan presents for   Pedal edema Discussed in October.  Compression stockings continued, started on chlorthalidone 12.5 mg daily.  Toprol dose was decreased.  Did not tolerate chlorthalidone, increased headaches.  Returned to her 37.5 mg of metoprolol and stopped chlorthalidone. Swelling depends on diet. Wearing stockings at work- helps.  No CP/dyspnea.   Hypertension: Toprol 37.5mg  QD.  Home readings:none recent. Usual 117/70's-130/80  BP Readings from Last 3 Encounters:  10/31/21 102/62  07/15/21 128/74  06/09/21 134/78   Lab Results  Component Value Date   CREATININE 0.79 06/09/2021   Fatigue With daytime somnolence discussed in October.  Referred to sleep studies for possible underlying OSA.  Has not had appointment yet - rescheduled to 11/15/21.  Feels same.  Has seen urology and GYN for recurrent UTi sx's.  Depression: Zoloft 50mg  qd, working well past year.   Depression screen [facility] 2/9 10/31/2021 07/15/2021 06/09/2021 07/26/2020 06/12/2019  Decreased Interest 0 0 1 0 0  Down, Depressed, Hopeless 0 0 1 0 0  PHQ - 2 Score 0 0 2 0 0  Altered sleeping 1 0 2 - -  Tired, decreased energy 2 2 3  - -  Change in appetite 0 1 0 - -  Feeling bad or failure about yourself  0 0 0 - -  Trouble concentrating 0 0 0 - -  Moving slowly or fidgety/restless 0 0 0 - -  Suicidal thoughts 0 0 0 - -  PHQ-9 Score 3 3 7  - -  Difficult doing work/chores Not difficult at all - - - -     History Patient Active Problem List   Diagnosis Date Noted   Allergic rhinitis due to pollen 11/18/2014   Essential hypertension, benign 11/18/2014   GERD 11/08/2009   OTHER DYSPHAGIA 11/08/2009   FLATULENCE ERUCTATION AND GAS PAIN 11/08/2009   Past Medical History:  Diagnosis Date   Allergy    Claritin daily yearround.   Arthritis     per pt   Depression    Esophageal stricture    GERD (gastroesophageal reflux disease)    Hiatal hernia    Hypertension    Pneumonia    Recurrent cold sores    Status post dilation of esophageal narrowing    Past Surgical History:  Procedure Laterality Date   Admission  10/09/1998   prolonged syncopal event; admission for 24 hours.  [facility]   BLADDER REPAIR  1998   David Lowe/Gynecology.   BREAST SURGERY  1999   breast reduction   KNEE ARTHROSCOPY Right    10 yrs ago   UPPER GASTROINTESTINAL ENDOSCOPY  2018   Allergies  Allergen Reactions   Gluten Meal Other (See Comments)   Wheat Bran    Prior to Admission medications   Medication Sig Start Date End Date Taking? Authorizing Provider  chlorthalidone (HYGROTON) 25 MG tablet Take 0.5 tablets (12.5 mg total) by mouth daily. 07/15/21  Yes 2000, MD  Cholecalciferol (VITAMIN D-3 PO) Vitamin D3   Yes [provider]  docusate sodium (COLACE) 100 MG capsule Take 100 mg by mouth as needed.    Yes [provider]  loratadine (CLARITIN) 10 MG tablet Take 10 mg by mouth daily.   Yes [provider]  meloxicam (MOBIC) 15 MG  tablet 7.5 mg as needed.  05/08/18  Yes [provider]  metoprolol succinate (TOPROL-XL) 25 MG 24 hr tablet TAKE 1 TABLET BY MOUTH ONCE DAILY Patient taking differently: 37.5 mg. TAKE 1.5 TABLET BY MOUTH ONCE DAILY 07/15/21  Yes Shade FloodGreene, Sascha Palma R, MD  Omega-3 Fatty Acids (FISH OIL PO) Fish Oil 1,200 mg (144 mg-216 mg) capsule   Yes [provider]  sertraline (ZOLOFT) 50 MG tablet Take 50 mg by mouth daily. 11/09/20  Yes [provider]  valACYclovir (VALTREX) 500 MG tablet Take 500 mg by mouth as needed.   Yes [provider]  Misc Natural Products (NEURIVA PO) Neuriva Original Patient not taking: Reported on 10/31/2021    [provider]   Social History   Socioeconomic History   Marital status: Married    Spouse name: Not on file    Number of children: 2   Years of education: Not on file   Highest education level: Not on file  Occupational History   Occupation: medical assistant    Employer: [employer]  Tobacco Use   Smoking status: Never   Smokeless tobacco: Never  Vaping Use   Vaping Use: Never used  Substance and Sexual Activity   Alcohol use: Yes    Comment: occasional   Drug use: No   Sexual activity: Yes  Other Topics Concern   Not on file  Social History Narrative   Marital status: married x 26 years; happily      Chldren: 2 children (22, 25); no grandchldren.      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   Social Determinants of Health   Financial Resource Strain: Not on file  Food Insecurity: Not on file  Transportation Needs: Not on file  Physical Activity: Not on file  Stress: Not on file  Social Connections: Not on file  Intimate Partner Violence: Not on file    Review of Systems  Constitutional:  Negative for fatigue and unexpected weight change.  Respiratory:  Negative for chest tightness and shortness of breath.   Cardiovascular:  Negative for chest pain, palpitations and leg swelling.  Gastrointestinal:  Negative for abdominal pain and blood in stool.  Neurological:  Negative for dizziness, syncope, light-headedness and headaches.    Objective:   Vitals:   10/31/21 0906  BP: 102/62  Pulse: 65  Temp: 98.1 F (36.7 C)  TempSrc: Temporal  SpO2: 95%  Weight: 237 lb (107.5 kg)  Height: 5\' 5"  (1.651 m)     Physical Exam Vitals reviewed.  Constitutional:      Appearance: Normal appearance. Hannah Buchanan is well-developed.  HENT:     Head: Normocephalic and atraumatic.  Eyes:     Conjunctiva/sclera: Conjunctivae normal.     Pupils: Pupils are equal, round, and reactive  to light.  Neck:     Vascular: No carotid bruit.  Cardiovascular:     Rate and Rhythm: Normal rate and regular rhythm.     Heart sounds: Normal heart sounds.  Pulmonary:     Effort: Pulmonary effort is normal.     Breath sounds: Normal breath sounds.  Abdominal:     Palpations: Abdomen is soft. There is  no pulsatile mass.     Tenderness: There is no abdominal tenderness.  Musculoskeletal:     Right lower leg: No edema.     Left lower leg: No edema.  Skin:    General: Skin is warm and dry.  Neurological:     Mental Status: Hannah Buchanan is alert and oriented to person, place, and time.  Psychiatric:        Mood and Affect: Mood normal.        Behavior: Behavior normal.       Assessment & Plan:  Hannah Buchanan is a 51 y.o. female . Fatigue, unspecified type  Essential hypertension - Plan: metoprolol succinate (TOPROL-XL) 25 MG 24 hr tablet  Pedal edema  Daytime somnolence  Blood pressure stable based on home readings.  No new side effects at 37.5 mg Toprol dosing.  Did not tolerate chlorthalidone.  Edema stable, no appreciable edema on exam.  Continue compression stockings.  Persistent fatigue but no changes.  Depression stable.  Sleep study/evaluation pending.  Possible contribution to fatigue.  Labs/work-up at this time with 22-month follow-up.  RTC precautions  Meds ordered this encounter  Medications   metoprolol succinate (TOPROL-XL) 25 MG 24 hr tablet    Sig: Take 1.5 tablets (37.5 mg total) by mouth daily. TAKE 1.5 TABLET BY MOUTH ONCE DAILY    Dispense:  135 tablet    Refill:  2   Patient Instructions  Keep follow up for sleep study, no med changes for now. Continue compression stockings.  Return to the clinic or go to the nearest emergency room if any of your symptoms worsen or new symptoms occur. Thanks for coming in today.      Signed,   Meredith Staggers, MD [facility], [facility] 10/31/21 9:48 AM

## 2021-10-31 NOTE — Patient Instructions (Addendum)
Keep follow up for sleep study, no med changes for now. Continue compression stockings.  Return to the clinic or go to the nearest emergency room if any of your symptoms worsen or new symptoms occur. Thanks for coming in today.

## 2021-11-15 ENCOUNTER — Encounter: Payer: Self-pay | Admitting: Neurology

## 2021-11-15 ENCOUNTER — Ambulatory Visit: Payer: BC Managed Care – PPO | Admitting: Neurology

## 2021-11-15 VITALS — BP 111/69 | HR 61 | Ht 65.0 in | Wt 235.4 lb

## 2021-11-15 DIAGNOSIS — R0689 Other abnormalities of breathing: Secondary | ICD-10-CM

## 2021-11-15 DIAGNOSIS — R0683 Snoring: Secondary | ICD-10-CM | POA: Diagnosis not present

## 2021-11-15 DIAGNOSIS — G4719 Other hypersomnia: Secondary | ICD-10-CM

## 2021-11-15 DIAGNOSIS — R002 Palpitations: Secondary | ICD-10-CM

## 2021-11-15 DIAGNOSIS — R351 Nocturia: Secondary | ICD-10-CM

## 2021-11-15 DIAGNOSIS — E669 Obesity, unspecified: Secondary | ICD-10-CM

## 2021-11-15 NOTE — Patient Instructions (Signed)

## 2021-11-15 NOTE — Progress Notes (Signed)
Subjective:    Patient ID: Hannah Buchanan is a 51 y.o. female.  HPI    Huston Foley, MD, PhD [facility] [address], [address] [address] [place], [place] [address]  Dear Dr. Neva Seat,   I saw your patient, Camiah Humm, upon your kind request, in my Sleep clinic today for initial consultation of her sleep disorder, in particular, concern for underlying obstructive sleep apnea.  The patient is unaccompanied today.  As you know, Ms. Coulibaly is a 51 year old right-handed woman with an underlying medical history of hypertension, edema, allergies, reflux disease, depression, arthritis, and obesity, who reports snoring and excessive daytime somnolence.  I reviewed your office notes from 07/15/2021 as well as 10/31/2021.  Her Epworth sleepiness score is 12 out of 24, fatigue severity score is 35 out of 63.  She has woken up occasionally with a sense of gasping for air.  Snoring can be loud and disturbing.  She has vivid dreams which is not a new finding.  She denies recurrent morning headaches but has nocturia about once or twice per average night, denies telltale symptoms of restless leg syndrome or leg twitching at night.  She is not aware of any family history of sleep apnea.  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  She goes to bed somewhere between midnight and 1:30 AM and rise time is generally between 8 and 8:30 AM.  She is working on weight loss, she has a TV in her bedroom but does not typically watch it at night.  She has a 78 year old son who lives with them, she has a 38 year old daughter who recently moved to [place].  She drinks alcohol rarely, maybe twice a year, she is a non-smoker.  She drinks coffee but 1 cup in the mornings, typically no other caffeine.  Her Past Medical History Is Significant For: Past Medical History:  Diagnosis Date   Allergy    Claritin daily yearround.   Arthritis    per pt   Depression     Esophageal stricture    GERD (gastroesophageal reflux disease)    Hiatal hernia    Hypertension    Pneumonia    Recurrent cold sores    Status post dilation of esophageal narrowing     Her Past Surgical History Is Significant For: Past Surgical History:  Procedure Laterality Date   Admission  10/09/1998   prolonged syncopal event; admission for 24 hours.  [facility]   BLADDER REPAIR  1998   [facility]/Gynecology.   BREAST SURGERY  1999   breast reduction   KNEE ARTHROSCOPY Right    10 yrs ago   UPPER GASTROINTESTINAL ENDOSCOPY  2018    Her Family History Is Significant For: Family History  Problem Relation Age of Onset   Arthritis Mother    Asthma Mother    Colon polyps Father    Diabetes Father    Arthritis Father        TKR B   Kidney disease Father    Colon cancer Father 58   Depression Sister    Asthma Sister    Migraines Sister    Alcohol abuse Brother    Depression Brother    Thyroid cancer Maternal Aunt    Cancer Maternal Uncle        unknown origin-[place]   Esophageal cancer Maternal Uncle 55   Uterine cancer Maternal Grandmother    Stomach cancer  Neg Hx    Rectal cancer Neg Hx    Sleep apnea Neg Hx     Her Social History Is Significant For: Social History   Socioeconomic History   Marital status: Married    Spouse name: Not on file   Number of children: 2   Years of education: Not on file   Highest education level: Not on file  Occupational History   Occupation: medical assistant    Employer: [employer]  Tobacco Use   Smoking status: Never   Smokeless tobacco: Never  Vaping Use   Vaping Use: Never used  Substance and Sexual Activity   Alcohol use: Yes    Comment: occasional   Drug use: No   Sexual activity: Yes  Other Topics Concern   Not on file  Social History Narrative   Marital status: married x 26 years; happily      Chldren: 2 children (22, 25); no grandchldren.      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   Social Determinants of Health   Financial Resource Strain: Not on file  Food Insecurity: Not on file  Transportation Needs: Not on file  Physical Activity: Not on file  Stress: Not on file  Social Connections: Not on file    Her Allergies Are:  Allergies  Allergen Reactions   Gluten Meal Other (See Comments)   Wheat Bran   :   Her Current Medications Are:  Outpatient Encounter Medications as of 11/15/2021  Medication Sig   Cholecalciferol (VITAMIN D-3 PO) Vitamin D3   docusate sodium (COLACE) 100 MG capsule Take 100 mg by mouth as needed.    loratadine (CLARITIN) 10 MG tablet Take 10 mg by mouth daily.   meloxicam (MOBIC) 15 MG tablet 7.5 mg as needed.    metoprolol succinate (TOPROL-XL) 25 MG 24 hr tablet Take 1.5 tablets (37.5 mg total) by mouth daily. TAKE 1.5 TABLET BY MOUTH ONCE DAILY   Omega-3 Fatty Acids (FISH OIL PO) Fish Oil 1,200 mg (144 mg-216 mg) capsule   sertraline (ZOLOFT) 50 MG tablet Take 50 mg by mouth daily.   valACYclovir (VALTREX) 500 MG tablet Take 500 mg by mouth as needed.   Misc Natural Products (NEURIVA PO) Neuriva Original (Patient not taking: Reported on 11/15/2021)   No facility-administered encounter medications on file as of 11/15/2021.  :   Review of Systems:  Out of a complete 14 point review of systems, all are reviewed and negative with the exception of these symptoms as listed below:  Review of Systems  Neurological:        Pt is here for sleep consult Pt states she snore,hypertension, and fatigue throughout the day. Pt denies Sleep study,headaches,and CPAP at home.   FSS;35 ESS;12   Objective:  Neurological Exam  Physical Exam Physical Examination:   Vitals:   11/15/21 1302  BP: 111/69  Pulse: 61    General Examination: The patient is  a very pleasant 51 y.o. female in no acute distress. She appears well-developed and well-nourished and well groomed.   HEENT: Normocephalic, atraumatic, pupils are equal, round and reactive to light, extraocular tracking is good without limitation to gaze excursion  or nystagmus noted. Hearing is grossly intact. Face is symmetric with normal facial animation. Speech is clear with no dysarthria noted. There is no hypophonia. There is no lip, neck/head, jaw or voice tremor. Neck is supple with full range of passive and active motion. There are no carotid bruits on auscultation. Oropharynx exam reveals: mild mouth dryness, adequate dental hygiene and moderate airway crowding secondary to small airway entry, Mallampati is class I, tonsils on the smaller side.  She has a slightly deviated septum to the left.  She has narrow nasal passages overall.  Tongue protrudes centrally and palate elevates symmetrically.  Neck circumference of 15 three-quarter inches.  No significant overbite.    Chest: Clear to auscultation without wheezing, rhonchi or crackles noted.  Heart: S1+S2+0, regular and normal without murmurs, rubs or gallops noted.   Abdomen: Soft, non-tender and non-distended.  Extremities: There is no pitting edema in the distal lower extremities bilaterally.  Nonpitting puffiness around the ankles bilaterally.  Skin: Warm and dry without trophic changes noted.   Musculoskeletal: exam reveals no obvious joint deformities.   Neurologically:  Mental status: The patient is awake, alert and oriented in all 4 spheres. Her immediate and remote memory, attention, language skills and fund of knowledge are appropriate. There is no evidence of aphasia, agnosia, apraxia or anomia. Speech is clear with normal prosody and enunciation. Thought process is linear. Mood is normal and affect is normal.  Cranial nerves II - XII are as described above under HEENT exam.  Motor exam: Normal bulk, strength and tone is  noted. There is no tremor, Romberg is negative. Reflexes are 2+ throughout. Fine motor skills and coordination: grossly intact.  Cerebellar testing: No dysmetria or intention tremor. There is no truncal or gait ataxia.  Sensory exam: intact to light touch in the upper and lower extremities.  Gait, station and balance: She stands easily. No veering to one side is noted. No leaning to one side is noted. Posture is age-appropriate and stance is narrow based. Gait shows normal stride length and normal pace. No problems turning are noted.   Assessment and Plan:  In summary, Lashena JALEENA SICKEL is a very pleasant 51 y.o.-year old female with an underlying medical history of hypertension, edema, allergies, reflux disease, depression, arthritis, and obesity, whose history and physical exam are concerning for obstructive sleep apnea (OSA). I had a long chat with the patient about my findings and the diagnosis of OSA, its prognosis and treatment options. We talked about medical treatments, surgical interventions and non-pharmacological approaches. I explained in particular the risks and ramifications of untreated moderate to severe OSA, especially with respect to developing cardiovascular disease down the Road, including congestive heart failure, difficult to treat hypertension, cardiac arrhythmias, or stroke. Even type 2 diabetes has, in part, been linked to untreated OSA. Symptoms of untreated OSA include daytime sleepiness, memory problems, mood irritability and mood disorder such as depression and anxiety, lack of energy, as well as recurrent headaches, especially morning headaches. We talked about trying to maintain a healthy lifestyle in general, as well as the importance of weight control. We also talked about the importance of good sleep hygiene. I recommended the following at this time: sleep study.  I outlined the differences between a laboratory attended sleep study versus home sleep test.  I explained the  sleep test procedure to the patient and also outlined possible surgical and non-surgical treatment options of OSA, including the use of a custom-made dental device (which would require a  referral to a specialist dentist or oral surgeon), upper airway surgical options, such as traditional UPPP or a novel less invasive surgical option in the form of Inspire hypoglossal nerve stimulation (which would involve a referral to an ENT surgeon). I also explained the CPAP treatment option to the patient, who indicated that she would be willing to try CPAP if the need arises. I explained the importance of being compliant with PAP treatment, not only for insurance purposes but primarily to improve Her symptoms, and for the patient's long term health benefit, including to reduce Her cardiovascular risks. I answered all her questions today and the patient was in agreement. I plan to see her back after the sleep study is completed and encouraged her to call with any interim questions, concerns, problems or updates.   Thank you very much for allowing me to participate in the care of this nice patient. If I can be of any further assistance to you please do not hesitate to call me at [phone].  Sincerely,   Star Age, MD, PhD

## 2021-11-30 ENCOUNTER — Ambulatory Visit (INDEPENDENT_AMBULATORY_CARE_PROVIDER_SITE_OTHER): Payer: BC Managed Care – PPO | Admitting: Neurology

## 2021-11-30 DIAGNOSIS — R002 Palpitations: Secondary | ICD-10-CM

## 2021-11-30 DIAGNOSIS — R0683 Snoring: Secondary | ICD-10-CM

## 2021-11-30 DIAGNOSIS — R351 Nocturia: Secondary | ICD-10-CM

## 2021-11-30 DIAGNOSIS — G4733 Obstructive sleep apnea (adult) (pediatric): Secondary | ICD-10-CM | POA: Diagnosis not present

## 2021-11-30 DIAGNOSIS — G4719 Other hypersomnia: Secondary | ICD-10-CM

## 2021-11-30 DIAGNOSIS — E669 Obesity, unspecified: Secondary | ICD-10-CM

## 2021-11-30 DIAGNOSIS — R0689 Other abnormalities of breathing: Secondary | ICD-10-CM

## 2021-12-05 NOTE — Progress Notes (Signed)
° °  [facility] ° °HOME SLEEP TEST (Watch PAT) REPORT ° °STUDY DATE: 11/30/2021 ° °DOB: 03/26/1971 ° °MRN: [id number] ° °ORDERING CLINICIAN: Alixandra Alfieri, MD, PhD °  °REFERRING CLINICIAN: Greene, Jeffrey R, MD  ° °CLINICAL INFORMATION/HISTORY: 50-year-old right-handed woman with an underlying medical history of hypertension, edema, allergies, reflux disease, depression, arthritis, and obesity, who reports snoring and excessive daytime somnolence.  ° °Epworth sleepiness score: 12/24. ° °BMI: 39.3 kg/m² ° °FINDINGS:  ° °Sleep Summary:  ° °Total Recording Time (hours, min): 8 hours, 21 minutes ° °Total Sleep Time (hours, min):  7 hours, 28 minutes  ° °Percent REM (%):    33.4%  ° °Respiratory Indices:  ° °Calculated pAHI (per hour):  23.1/hour        ° °REM pAHI:    50.2/hour      ° °NREM pAHI: 9.5/hour ° °Oxygen Saturation Statistics:  °  °Oxygen Saturation (%) Mean: 93%  ° °Minimum oxygen saturation (%):                 76%  ° °O2 Saturation Range (%): 76-98%   ° °O2 Saturation (minutes) <=88%: 11.2 min ° °Pulse Rate Statistics:  ° °Pulse Mean (bpm):    71/min   ° °Pulse Range (59-88/min)  ° °IMPRESSION: OSA (obstructive sleep apnea)  ° °RECOMMENDATION:  °This home sleep test demonstrates moderate obstructive sleep apnea with a total AHI of 23.1/hour and O2 nadir of 76%.  Mild to moderate snoring was detected.  Treatment with positive airway pressure is recommended. The patient will be advised to proceed with an autoPAP titration/trial at home for now. A full night titration study may be considered to optimize treatment settings, if needed down the road. Please note that untreated obstructive sleep apnea may carry additional perioperative morbidity. Patients with significant obstructive sleep apnea should receive perioperative PAP therapy and the surgeons and particularly the anesthesiologist should be informed of the diagnosis and the severity of the sleep disordered breathing.  Alternative treatment  options may include dental option with an oral appliance or potential surgical treatment with a hypoglossal nerve stimulator.  Concomitant weight loss is recommended. °The patient should be cautioned not to drive, work at heights, or operate dangerous or heavy equipment when tired or sleepy. Review and reiteration of good sleep hygiene measures should be pursued with any patient. °Other causes of the patient's symptoms, including circadian rhythm disturbances, an underlying mood disorder, medication effect and/or an underlying medical problem cannot be ruled out based on this test. Clinical correlation is recommended. The patient and her referring provider will be notified of the test results. The patient will be seen in follow up in sleep clinic at [facility]. ° °I certify that I have reviewed the raw data recording prior to the issuance of this report in accordance with the standards of the American Academy of Sleep Medicine (AASM). ° ° °INTERPRETING PHYSICIAN:  ° °Kyah Buesing, MD, PhD  °Board Certified in Neurology and Sleep Medicine ° °[facility] °[address], [address] °[place], [place] [address] °[phone] ° ° ° ° ° ° ° ° ° ° ° ° ° ° ° ° ° °

## 2021-12-05 NOTE — Procedures (Signed)
° °  [facility]  HOME SLEEP TEST (Watch PAT) REPORT  STUDY DATE: 11/30/2021  DOB: [birth year]  MRN: [id number]  ORDERING CLINICIAN: Huston Foley, MD, PhD   REFERRING CLINICIAN: Shade Flood, MD   CLINICAL INFORMATION/HISTORY: 51 year old right-handed woman with an underlying medical history of hypertension, edema, allergies, reflux disease, depression, arthritis, and obesity, who reports snoring and excessive daytime somnolence.   Epworth sleepiness score: 12/24.  BMI: 39.3 kg/m  FINDINGS:   Sleep Summary:   Total Recording Time (hours, min): 8 hours, 21 minutes  Total Sleep Time (hours, min):  7 hours, 28 minutes   Percent REM (%):    33.4%   Respiratory Indices:   Calculated pAHI (per hour):  23.1/hour         REM pAHI:    50.2/hour       NREM pAHI: 9.5/hour  Oxygen Saturation Statistics:    Oxygen Saturation (%) Mean: 93%   Minimum oxygen saturation (%):                 76%   O2 Saturation Range (%): 76-98%    O2 Saturation (minutes) <=88%: 11.2 min  Pulse Rate Statistics:   Pulse Mean (bpm):    71/min    Pulse Range (59-88/min)   IMPRESSION: OSA (obstructive sleep apnea)   RECOMMENDATION:  This home sleep test demonstrates moderate obstructive sleep apnea with a total AHI of 23.1/hour and O2 nadir of 76%.  Mild to moderate snoring was detected.  Treatment with positive airway pressure is recommended. The patient will be advised to proceed with an autoPAP titration/trial at home for now. A full night titration study may be considered to optimize treatment settings, if needed down the road. Please note that untreated obstructive sleep apnea may carry additional perioperative morbidity. Patients with significant obstructive sleep apnea should receive perioperative PAP therapy and the surgeons and particularly the anesthesiologist should be informed of the diagnosis and the severity of the sleep disordered breathing.  Alternative treatment  options may include dental option with an oral appliance or potential surgical treatment with a hypoglossal nerve stimulator.  Concomitant weight loss is recommended. The patient should be cautioned not to drive, work at heights, or operate dangerous or heavy equipment when tired or sleepy. Review and reiteration of good sleep hygiene measures should be pursued with any patient. Other causes of the patient's symptoms, including circadian rhythm disturbances, an underlying mood disorder, medication effect and/or an underlying medical problem cannot be ruled out based on this test. Clinical correlation is recommended. The patient and her referring provider will be notified of the test results. The patient will be seen in follow up in [facility].  I certify that I have reviewed the raw data recording prior to the issuance of this report in accordance with the standards of the American Academy of Sleep Medicine (AASM).   INTERPRETING PHYSICIAN:   Huston Foley, MD, PhD  Board Certified in Neurology and Sleep Medicine  [facility] [address], [address] [place], [place] [address] [phone]

## 2021-12-05 NOTE — Addendum Note (Signed)
Addended by: Huston Foley on: 12/05/2021 06:28 PM   Modules accepted: Orders

## 2021-12-06 ENCOUNTER — Telehealth: Payer: Self-pay

## 2021-12-06 NOTE — Telephone Encounter (Signed)
-----   Message from Star Age, MD sent at 12/05/2021  6:28 PM EST ----- Patient referred by Dr. Carlota Raspberry, seen by me on 11/15/21, patient had a HST on 11/30/21.    Please call and notify the patient that the recent home sleep test showed obstructive sleep apnea in the moderate range. I recommend treatment in the form of autoPAP, which means, that we don't have to bring her in for a sleep study with CPAP, but will let her start using a so called autoPAP machine at home, which is a CPAP-like machine with self-adjusting pressures. We will send the order to a local DME company (of her choice, or as per insurance requirement). The DME representative will fit her with a mask, educate her on how to use the machine, how to put the mask on, etc. I have placed an order in the chart. Please send the order, talk to patient, send report to referring MD. We will need a FU in sleep clinic for 10 weeks post-PAP set up, please arrange that with me or one of our NPs. Also reinforce the need for compliance with treatment. Thanks,   Star Age, MD, PhD [facility])

## 2021-12-06 NOTE — Telephone Encounter (Signed)
I called pt. I advised pt that Dr. Frances Furbish reviewed their sleep study results and found that pt has moderate osa. Dr. Frances Furbish recommends that pt start an auto pap at home. I reviewed PAP compliance expectations with the pt. Pt is agreeable to starting an auto-PAP. I advised pt that an order will be sent to a DME, [facility], and [facility] will call the pt within about one week after they file with the pt's insurance. [facility] will show the pt how to use the machine, fit for masks, and troubleshoot the auto-PAP if needed. A follow up appt was made for insurance purposes with Amy, NP on 03/09/2022 at 9:00am. Pt verbalized understanding to arrive 15 minutes early and bring their auto-PAP. A letter with all of this information in it will be mailed to the pt as a reminder. I verified with the pt that the address we have on file is correct. Pt verbalized understanding of results. Pt had no questions at this time but was encouraged to call back if questions arise. I have sent the order to [facility] and have received confirmation that they have received the order.

## 2021-12-26 NOTE — Telephone Encounter (Signed)
[employer]irsense 11 Auto ?Setup 12/23/21 (appt needed 01/24/22 - 03/25/22) ?

## 2022-01-23 ENCOUNTER — Other Ambulatory Visit: Payer: Self-pay | Admitting: Family Medicine

## 2022-01-23 DIAGNOSIS — R600 Localized edema: Secondary | ICD-10-CM

## 2022-01-23 DIAGNOSIS — M79672 Pain in left foot: Secondary | ICD-10-CM | POA: Insufficient documentation

## 2022-01-23 DIAGNOSIS — I10 Essential (primary) hypertension: Secondary | ICD-10-CM

## 2022-03-09 ENCOUNTER — Ambulatory Visit: Payer: BC Managed Care – PPO | Admitting: Family Medicine

## 2022-03-22 ENCOUNTER — Telehealth: Payer: Self-pay | Admitting: *Deleted

## 2022-03-22 NOTE — Telephone Encounter (Signed)
Received fax from [place] that pt was refusing the use of or refusing the delivery of CPAP machine. Pt signed document that she was informed of the potential danger/consequences to herself due to the refusal/removal of said equipment.  Released [facility] as well as attending MD from any or all responsibility for an consequences associated with or created by the removal of said equipment done so with medical approval.

## 2022-03-22 NOTE — Telephone Encounter (Signed)
I called pt and she said that she was not able to use the machine, tried several masks, was in touch with [facility].  Tried for 2 wks, felt like she was drowning , could not breathe.  She decided she could not do it.  She said that she has deviated septum and polyp, getting that repaired first, then may retry.  She understands that not using cpap for OSA  can cause problems with heart, lungs, stroke, memory, headaches.  I told her will relay to Dr. Frances Furbish.  She turned in machine yesterday.

## 2022-03-22 NOTE — Telephone Encounter (Signed)
LMVM for pt to return call.   

## 2022-03-23 NOTE — Telephone Encounter (Signed)
She can keep FU appt with AL, to discuss restart of autoPAP or alternative treatments.

## 2022-03-23 NOTE — Telephone Encounter (Signed)
Called pt on mobile # and LVM (ok per DPR) advising pt that Dr Frances Furbish is aware she did not tolerate the CPAP and that the provider advises patient keep the 7/13 8:30 AM appt to discuss alternative treatments for sleep apnea and restarting PAP therapy. Left office number for call back if needed.

## 2022-03-31 DIAGNOSIS — L237 Allergic contact dermatitis due to plants, except food: Secondary | ICD-10-CM | POA: Insufficient documentation

## 2022-04-19 NOTE — Progress Notes (Unsigned)
PATIENT: Hannah Buchanan DOB: Oct 09, 1971  REASON FOR VISIT: follow up HISTORY FROM: patient  No chief complaint on file.    HISTORY OF PRESENT ILLNESS:  04/19/22 ALL:  GARRETT BOWRING is a 51 y.o. female here today for follow up for OSA. She was seen in consult with Dr Frances Furbish 11/2021 for snoring and EDS. HST 11/30/2021 showed moderate OSA with total AHI of 23.1/hr and O2 nadir 76%. AutoPAP was advised.     HISTORY: (copied from Dr Teofilo Pod previous note)  Dear Dr. Neva Seat,    I saw your patient, Hannah Buchanan, upon your kind request, in my Sleep clinic today for initial consultation of her sleep disorder, in particular, concern for underlying obstructive sleep apnea.  The patient is unaccompanied today.  As you know, Hannah Buchanan is a 51 year old right-handed woman with an underlying medical history of hypertension, edema, allergies, reflux disease, depression, arthritis, and obesity, who reports snoring and excessive daytime somnolence.  I reviewed your office notes from 07/15/2021 as well as 10/31/2021.  Her Epworth sleepiness score is 12 out of 24, fatigue severity score is 35 out of 63.  She has woken up occasionally with a sense of gasping for air.  Snoring can be loud and disturbing.  She has vivid dreams which is not a new finding.  She denies recurrent morning headaches but has nocturia about once or twice per average night, denies telltale symptoms of restless leg syndrome or leg twitching at night.  She is not aware of any family history of sleep apnea.  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  She goes to bed somewhere between midnight and 1:30 AM and rise time is generally between 8 and 8:30 AM.  She is working on weight loss, she has a TV in her bedroom but does not typically watch it at night.  She has a 8 year old son who lives with them, she has a 36 year old daughter who recently moved to [place].  She drinks alcohol rarely, maybe  twice a year, she is a non-smoker.  She drinks coffee but 1 cup in the mornings, typically no other caffeine.   REVIEW OF SYSTEMS: Out of a complete 14 system review of symptoms, the patient complains only of the following symptoms, and all other reviewed systems are negative.  ESS:  ALLERGIES: Allergies  Allergen Reactions   Gluten Meal Other (See Comments)   Wheat Bran     HOME MEDICATIONS: Outpatient Medications Prior to Visit  Medication Sig Dispense Refill   chlorthalidone (HYGROTON) 25 MG tablet TAKE 1/2 TABLET(12.5 MG) BY MOUTH DAILY 45 tablet 1   Cholecalciferol (VITAMIN D-3 PO) Vitamin D3     docusate sodium (COLACE) 100 MG capsule Take 100 mg by mouth as needed.      loratadine (CLARITIN) 10 MG tablet Take 10 mg by mouth daily.     meloxicam (MOBIC) 15 MG tablet 7.5 mg as needed.   2   metoprolol succinate (TOPROL-XL) 25 MG 24 hr tablet Take 1.5 tablets (37.5 mg total) by mouth daily. TAKE 1.5 TABLET BY MOUTH ONCE DAILY 135 tablet 2   Misc Natural Products (NEURIVA PO) Neuriva Original (Patient not taking: Reported on 11/15/2021)     Omega-3 Fatty Acids (FISH OIL PO) Fish Oil 1,200 mg (144 mg-216 mg) capsule     sertraline (ZOLOFT) 50 MG tablet Take 50 mg by mouth daily.     valACYclovir (VALTREX)  500 MG tablet Take 500 mg by mouth as needed.     No facility-administered medications prior to visit.    PAST MEDICAL HISTORY: Past Medical History:  Diagnosis Date   Allergy    Claritin daily yearround.   Arthritis    per pt   Depression    Esophageal stricture    GERD (gastroesophageal reflux disease)    Hiatal hernia    Hypertension    Pneumonia    Recurrent cold sores    Status post dilation of esophageal narrowing     PAST SURGICAL HISTORY: Past Surgical History:  Procedure Laterality Date   Admission  10/09/1998   prolonged syncopal event; admission for 24 hours.  [facility]   BLADDER REPAIR  1998   [facility].   BREAST SURGERY  1999   breast  reduction   KNEE ARTHROSCOPY Right    10 yrs ago   UPPER GASTROINTESTINAL ENDOSCOPY  2018    FAMILY HISTORY: Family History  Problem Relation Age of Onset   Arthritis Mother    Asthma Mother    Colon polyps Father    Diabetes Father    Arthritis Father        TKR B   Kidney disease Father    Colon cancer Father 22   Depression Sister    Asthma Sister    Migraines Sister    Alcohol abuse Brother    Depression Brother    Thyroid cancer Maternal Aunt    Cancer Maternal Uncle        unknown origin-Mets   Esophageal cancer Maternal Uncle 55   Uterine cancer Maternal Grandmother    Stomach cancer Neg Hx    Rectal cancer Neg Hx    Sleep apnea Neg Hx     SOCIAL HISTORY: Social History   Socioeconomic History   Marital status: Married    Spouse name: Not on file   Number of children: 2   Years of education: Not on file   Highest education level: Not on file  Occupational History   Occupation: Manufacturing engineer: [employer]  Tobacco Use   Smoking status: Never   Smokeless tobacco: Never  Vaping Use   Vaping Use: Never used  Substance and Sexual Activity   Alcohol use: Yes    Comment: occasional   Drug use: No   Sexual activity: Yes  Other Topics Concern   Not on file  Social History Narrative   Marital status: married x 26 years; happily      Chldren: 2 children (22, 25); no grandchldren.      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   Social Determinants of Health   Financial Resource Strain: Not on file  Food Insecurity: Not on file  Transportation Needs: Not on file  Physical Activity: Not on file  Stress: Not on file  Social Connections: Not on file  Intimate Partner Violence: Not on file     PHYSICAL EXAM  There were no vitals  filed for this visit. There is no height or weight on file to calculate BMI.  Generalized: Well developed, in no acute distress  Cardiology: normal rate and  rhythm, no murmur noted Respiratory: clear to auscultation bilaterally  Neurological examination  Mentation: Alert oriented to time, place, history taking. Follows all commands speech and language fluent Cranial nerve II-XII: Pupils were equal round reactive to light. Extraocular movements were full, visual field were full  Motor: The motor testing reveals 5 over 5 strength of all 4 extremities. Good symmetric motor tone is noted throughout.  Gait and station: Gait is normal.    DIAGNOSTIC DATA (LABS, IMAGING, TESTING) - I reviewed patient records, labs, notes, testing and imaging myself where available.      No data to display           Lab Results  Component Value Date   WBC 5.5 06/09/2021   HGB 13.2 06/09/2021   HCT 38.7 06/09/2021   MCV 90.7 06/09/2021   PLT 169.0 06/09/2021      Component Value Date/Time   NA 139 06/09/2021 1235   NA 139 07/30/2020 1146   K 4.5 06/09/2021 1235   CL 104 06/09/2021 1235   CO2 29 06/09/2021 1235   GLUCOSE 92 06/09/2021 1235   BUN 19 06/09/2021 1235   BUN 19 07/30/2020 1146   CREATININE 0.79 06/09/2021 1235   CREATININE 0.84 12/28/2015 1608   CALCIUM 9.3 06/09/2021 1235   PROT 6.7 06/09/2021 1235   PROT 6.9 07/30/2020 1146   ALBUMIN 4.3 06/09/2021 1235   ALBUMIN 4.5 07/30/2020 1146   AST 18 06/09/2021 1235   ALT 22 06/09/2021 1235   ALKPHOS 73 06/09/2021 1235   BILITOT 0.6 06/09/2021 1235   BILITOT 0.5 07/30/2020 1146   GFRNONAA 79 07/30/2020 1146   GFRAA 91 07/30/2020 1146   Lab Results  Component Value Date   CHOL 185 06/09/2021   HDL 56.10 06/09/2021   LDLCALC 111 (H) 06/09/2021   TRIG 85.0 06/09/2021   CHOLHDL 3 06/09/2021   Lab Results  Component Value Date   HGBA1C 5.3 07/30/2020   Lab Results  Component Value Date   VITAMINB12 579 05/18/2017   Lab  Results  Component Value Date   TSH 1.61 06/09/2021     ASSESSMENT AND PLAN 51 y.o. year old female  has a past medical history of Allergy, Arthritis, Depression, Esophageal stricture, GERD (gastroesophageal reflux disease), Hiatal hernia, Hypertension, Pneumonia, Recurrent cold sores, and Status post dilation of esophageal narrowing. here with   No diagnosis found.    Hannah Buchanan is doing well on CPAP therapy. Compliance report reveals ***. *** was encouraged to continue using CPAP nightly and for greater than 4 hours each night. We will update supply orders as indicated. Risks of untreated sleep apnea review and education materials provided. Healthy lifestyle habits encouraged. *** will follow up in ***, sooner if needed. *** verbalizes understanding and agreement with this plan.    No orders of the defined types were placed in this encounter.    No orders of the defined types were placed in this encounter.     Shawnie Dapper, FNP-C 04/19/2022, 7:58 AM [facility] [address] [place], [place] [address] [phone]

## 2022-04-19 NOTE — Patient Instructions (Incomplete)
Please continue using your CPAP regularly. While your insurance requires that you use CPAP at least 4 hours each night on 70% of the nights, I recommend, that you not skip any nights and use it throughout the night if you can. Getting used to CPAP and staying with the treatment long term does take time and patience and discipline. Untreated obstructive sleep apnea when it is moderate to severe can have an adverse impact on cardiovascular health and raise her risk for heart disease, arrhythmias, hypertension, congestive heart failure, stroke and diabetes. Untreated obstructive sleep apnea causes sleep disruption, nonrestorative sleep, and sleep deprivation. This can have an impact on your day to day functioning and cause daytime sleepiness and impairment of cognitive function, memory loss, mood disturbance, and problems focussing. Using CPAP regularly can improve these symptoms.   Follow up in

## 2022-04-20 ENCOUNTER — Ambulatory Visit: Payer: BC Managed Care – PPO | Admitting: Family Medicine

## 2022-04-20 ENCOUNTER — Encounter: Payer: Self-pay | Admitting: Family Medicine

## 2022-04-20 VITALS — BP 115/75 | HR 74 | Ht 65.0 in | Wt 235.0 lb

## 2022-04-20 DIAGNOSIS — G4733 Obstructive sleep apnea (adult) (pediatric): Secondary | ICD-10-CM

## 2022-05-01 ENCOUNTER — Encounter: Payer: Self-pay | Admitting: Family Medicine

## 2022-05-01 ENCOUNTER — Ambulatory Visit (INDEPENDENT_AMBULATORY_CARE_PROVIDER_SITE_OTHER): Payer: BC Managed Care – PPO | Admitting: Family Medicine

## 2022-05-01 VITALS — BP 128/76 | HR 63 | Temp 98.3°F | Resp 16 | Ht 63.5 in | Wt 233.6 lb

## 2022-05-01 DIAGNOSIS — R739 Hyperglycemia, unspecified: Secondary | ICD-10-CM

## 2022-05-01 DIAGNOSIS — G4733 Obstructive sleep apnea (adult) (pediatric): Secondary | ICD-10-CM | POA: Diagnosis not present

## 2022-05-01 DIAGNOSIS — Z0000 Encounter for general adult medical examination without abnormal findings: Secondary | ICD-10-CM

## 2022-05-01 DIAGNOSIS — I10 Essential (primary) hypertension: Secondary | ICD-10-CM

## 2022-05-01 DIAGNOSIS — Z808 Family history of malignant neoplasm of other organs or systems: Secondary | ICD-10-CM

## 2022-05-01 DIAGNOSIS — E785 Hyperlipidemia, unspecified: Secondary | ICD-10-CM | POA: Diagnosis not present

## 2022-05-01 DIAGNOSIS — Z1329 Encounter for screening for other suspected endocrine disorder: Secondary | ICD-10-CM

## 2022-05-01 MED ORDER — METOPROLOL SUCCINATE ER 25 MG PO TB24: 37.5000 mg | ORAL_TABLET | Freq: Every day | ORAL | 2 refills | Status: DC

## 2022-05-01 NOTE — Progress Notes (Signed)
Subjective:  Patient ID: Hannah Buchanan, female    DOB: 1971-01-12  Age: 51 y.o. MRN: [id number]  CC:  Chief Complaint  Patient presents with   Annual Exam    Pt is not fasting     HPI Hannah Buchanan presents for Annual Exam  No changes to health.   Care team: PCP: me Neuro/sleep specialist, Dr.Athar Ortho/sports medicine, Dr. Delilah Shan Gastroenterology, Dr. Fuller Plan Cardiology, Dr. Einar Gip GYN, Dr. Gaetano Net  Hypertension: Toprol XL 37.5 mg daily (1.5 tablets of 25 mg), chlorthalidone only as needed for swelling - 2 times per month. History of pedal edema treated with compression stockings.  Diet component to edema, improved with use of compression stockings. Home readings: 120/60's.  BP Readings from Last 3 Encounters:  05/01/22 128/76  04/20/22 115/75  11/15/21 111/69   Lab Results  Component Value Date   CREATININE 0.79 06/09/2021   Obstructive sleep apnea Moderate OSA with AHI 23.1, O2 nadir 76% in February.  AutoPap started.  Discomfort, sensation of drowning with use of mask.  Unable to tolerate CPAP.  Option of ENT eval with history of nasal polyp.  Option of dental appliance.  Not thought to be candidate for inspire.  Elevation of head of bed discussed - sleeps elevated.  Fever blisters: Treats with valtrex. Recurrent.  Multiple flairs - stress, sun, sickness.   Depression Treated with sertraline 100 mg daily. Some days may need stronger dose, but overall stable.       05/01/2022    2:12 PM 10/31/2021    9:09 AM 07/15/2021   10:53 AM 06/09/2021   11:54 AM 07/26/2020    3:50 PM  Depression screen PHQ 2/9  Decreased Interest 0 0 0 1 0  Down, Depressed, Hopeless 0 0 0 1 0  PHQ - 2 Score 0 0 0 2 0  Altered sleeping  1 0 2   Tired, decreased energy  _0 Change in appetite  0 1 0   Feeling bad or failure about yourself   0 0 0   Trouble concentrating  0 0 0   Moving slowly or fidgety/restless  0 0 0   Suicidal thoughts  0 0 0   PHQ-9 Score  _1 Difficult  doing work/chores  Not difficult at all       Health Maintenance  Topic Date Due   Zoster Vaccines- Shingrix (1 of 2) 08/01/2022 (Originally 05/28/2020)   MAMMOGRAM  05/02/2023 (Originally 05/28/2020)   TETANUS/TDAP  05/02/2023 (Originally 12/14/2019)   INFLUENZA VACCINE  05/09/2022   PAP SMEAR-Modifier  12/27/2023   COLONOSCOPY (Pts 45-33yr Insurance coverage will need to be confirmed)  12/02/2030   Hepatitis C Screening  Completed   HIV Screening  Completed   HPV VACCINES  Aged Out   COVID-19 Vaccine  Discontinued  Colonoscopy 12/02/2020.  Dr. SFuller Plan  Mild diverticulosis otherwise normal.  Repeat 10 years.  Mammogram: recent at GYN.  Pap testing, followed by GYN - pap recently.  Dr. TGaetano Net  Father metastatic stage 4 lung CA. Smoker.  Maternal GM with uterine CA Maternal aunt with thyroid CA.    Immunization History  Administered Date(s) Administered   Tdap 12/13/2009  COVID-vaccine -declines.  Shingles vaccine - declines  Unknown last Td - plans to check and update.   No results found. Optho - 2 years ago. Readers. Increased pressure in past - plans to schedule appt.   Dental: once per year to 6 months.  Alcohol: rare  Tobacco: none  Exercise/obesity: Frequent walking at work. 8-15k steps per day.  Wt Readings from Last 3 Encounters:  05/01/22 233 lb 9.6 oz (106 kg)  04/20/22 235 lb (106.6 kg)  11/15/21 235 lb 6.4 oz (106.8 kg)  Body mass index is 40.73 kg/m. Lab Results  Component Value Date   HGBA1C 5.3 07/30/2020     History Patient Active Problem List   Diagnosis Date Noted   Allergic rhinitis due to pollen 11/18/2014   Essential hypertension, benign 11/18/2014   GERD 11/08/2009   OTHER DYSPHAGIA 11/08/2009   FLATULENCE ERUCTATION AND GAS PAIN 11/08/2009   Past Medical History:  Diagnosis Date   Allergy    Claritin daily yearround.   Arthritis    per pt   Depression    Esophageal stricture    GERD (gastroesophageal reflux disease)    Hiatal  hernia    Hypertension    Pneumonia    Recurrent cold sores    Status post dilation of esophageal narrowing    Past Surgical History:  Procedure Laterality Date   Admission  10/09/1998   prolonged syncopal event; admission for 24 hours.  [facility]   BLADDER REPAIR  1998   [facility]/Gynecology.   BREAST SURGERY  1999   breast reduction   KNEE ARTHROSCOPY Right    10 yrs ago   UPPER GASTROINTESTINAL ENDOSCOPY  2018   Allergies  Allergen Reactions   Gluten Meal Other (See Comments)   Wheat Bran    Prior to Admission medications   Medication Sig Start Date End Date Taking? Authorizing Provider  chlorthalidone (HYGROTON) 25 MG tablet TAKE 1/2 TABLET(12.5 MG) BY MOUTH DAILY Patient taking differently: Take 0.5 tablets by mouth as needed. 01/24/22   Wendie Agreste, MD  Cholecalciferol (VITAMIN D-3 PO) Vitamin D3    [provider]  docusate sodium (COLACE) 100 MG capsule Take 100 mg by mouth as needed.     [provider]  loratadine (CLARITIN) 10 MG tablet Take 10 mg by mouth daily.    [provider]  meloxicam (MOBIC) 15 MG tablet 7.5 mg as needed.  05/08/18   [provider]  metoprolol succinate (TOPROL-XL) 25 MG 24 hr tablet Take 1.5 tablets (37.5 mg total) by mouth daily. TAKE 1.5 TABLET BY MOUTH ONCE DAILY 10/31/21   Wendie Agreste, MD  Misc Natural Products (NEURIVA PO)     [provider]  Omega-3 Fatty Acids (FISH OIL PO) Fish Oil 1,200 mg (144 mg-216 mg) capsule    [provider]  Probiotic Product (PROBIOTIC DAILY PO) Take 1 tablet by mouth daily.    [provider]  sertraline (ZOLOFT) 100 MG tablet Take 100 mg by mouth daily. 01/23/22   [provider]  valACYclovir (VALTREX) 500 MG tablet Take 500 mg by mouth as needed.    [provider]   Social History   Socioeconomic History   Marital status: Married    Spouse name: Not on file   Number of children: 2   Years of education: Not  on file   Highest education level: Not on file  Occupational History   Occupation: medical assistant    Employer: [employer]  Tobacco Use   Smoking status: Never   Smokeless tobacco: Never  Vaping Use   Vaping Use: Never used  Substance and Sexual Activity   Alcohol use: Yes    Comment: occasional   Drug use: No   Sexual activity:  Yes  Other Topics Concern   Not on file  Social History Narrative   Marital status: married x 26 years; happily      Chldren: 2 children (22, 25); no grandchldren.      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   Social Determinants of Health   Financial Resource Strain: Not on file  Food Insecurity: Not on file  Transportation Needs: Not on file  Physical Activity: Not on file  Stress: Not on file  Social Connections: Not on file  Intimate Partner Violence: Not on file    Review of Systems 13 point review of systems per patient health survey noted.  Negative other than as indicated above or in HPI.    Objective:   Vitals:   05/01/22 1408  BP: 128/76  Pulse: 63  Resp: 16  Temp: 98.3 F (36.8 C)  TempSrc: Oral  SpO2: 98%  Weight: 233 lb 9.6 oz (106 kg)  Height: 5' 3.5" (1.613 m)     Physical Exam Vitals reviewed.  Constitutional:      Appearance: She is well-developed.  HENT:     Head: Normocephalic and atraumatic.     Right Ear: External ear normal.     Left Ear: External ear normal.  Eyes:     Conjunctiva/sclera: Conjunctivae normal.     Pupils: Pupils are equal, round, and reactive to light.  Neck:     Thyroid: No thyromegaly.  Cardiovascular:     Rate and Rhythm: Normal rate and regular rhythm.     Heart sounds: Normal heart sounds. No murmur heard. Pulmonary:     Effort: Pulmonary effort is normal. No  respiratory distress.     Breath sounds: Normal breath sounds. No wheezing.  Abdominal:     General: Bowel sounds are normal.     Palpations: Abdomen is soft.     Tenderness: There is no abdominal tenderness.  Musculoskeletal:        General: No tenderness. Normal range of motion.     Cervical back: Normal range of motion and neck supple.  Lymphadenopathy:     Cervical: No cervical adenopathy.  Skin:    General: Skin is warm and dry.     Findings: No rash.  Neurological:     Mental Status: She is alert and oriented to person, place, and time.  Psychiatric:        Behavior: Behavior normal.        Thought Content: Thought content normal.        Assessment & Plan:  KALENNA MILLETT is a 51 y.o. female . Annual physical exam - Plan: Comprehensive metabolic panel, Lipid panel, Hemoglobin A1c  - -anticipatory guidance as below in AVS, screening labs above. Health maintenance items as above in HPI discussed/recommended as applicable.   -Option of higher dose of sertraline if needed for mood symptoms.  -Follow-up with ophthalmology given report of previous elevated intraocular pressure.  Asymptomatic.  Essential hypertension - Plan: metoprolol succinate (TOPROL-XL) 25 MG 24 hr tablet, Comprehensive metabolic panel  -  Stable, tolerating current regimen. Medications refilled. Labs pending as above.   OSA (obstructive sleep apnea) - Plan: Ambulatory referral to ENT  -  Unfortunately unable to tolerate positive airway pressure treatments.  Not interested in inspire procedure but would like to meet with ENT regarding nasal polyps, and other treatment options.  Also plans to discuss dental device with dental provider.  Hyperlipidemia, unspecified hyperlipidemia type - Plan: Lipid panel  -Check labs, adjust or consider medications accordingly.  Hyperglycemia - Plan: Hemoglobin A1c  -Mild elevation previously, check A1c to screen for diabetes/prediabetes.  Screening for thyroid disorder -  Plan: TSH Family history of thyroid cancer - Plan: TSH  -No nodules palpated on exam, check TSH.  Daily dosing of Valtrex 500 mg discussed for orolabial herpes prevention with 2 g dosing for acute flares.  Option of higher daily dosing if needed.  Meds ordered this encounter  Medications   metoprolol succinate (TOPROL-XL) 25 MG 24 hr tablet    Sig: Take 1.5 tablets (37.5 mg total) by mouth daily. TAKE 1.5 TABLET BY MOUTH ONCE DAILY    Dispense:  135 tablet    Refill:  2   Patient Instructions  Try taking Valtrex 500 mg daily for prevention, then if you do have a flare increase to 2 g once with repeat dose in 12 hours.  Then return to 500 mg daily.  If that does not slow down frequency of flares, let me know and we can potentially try 1000 mg daily.  I do recommend follow-up with eye specialist to recheck pressure in the eyes.  I placed a referral to ENT to look into other treatment options for sleep apnea.  Can follow-up with dentist to discuss mouthguard as well.  If increased depression symptoms, can increase to 1-1/2 pills/day.  Let me know if you make that change.  No other medication changes today.  I will let you know if there are any concerns on labs.  Thanks for coming in today and take care.  Preventive Care 23-45 Years Old, Female Preventive care refers to lifestyle choices and visits with your health care provider that can promote health and wellness. Preventive care visits are also called wellness exams. What can I expect for my preventive care visit? Counseling Your health care provider may ask you questions about your: Medical history, including: Past medical problems. Family medical history. Pregnancy history. Current health, including: Menstrual cycle. Method of birth control. Emotional well-being. Home life and relationship well-being. Sexual activity and sexual health. Lifestyle, including: Alcohol, nicotine or tobacco, and drug use. Access to firearms. Diet,  exercise, and sleep habits. Work and work Statistician. Sunscreen use. Safety issues such as seatbelt and bike helmet use. Physical exam Your health care provider will check your: Height and weight. These may be used to calculate your BMI (body mass index). BMI is a measurement that tells if you are at a healthy weight. Waist circumference. This measures the distance around your waistline. This measurement also tells if you are at a healthy weight and may help predict your risk of certain diseases, such as type 2 diabetes and high blood pressure. Heart rate and blood pressure. Body temperature. Skin for abnormal spots. What immunizations do I need?  Vaccines are usually given at various ages, according to a schedule. Your health care provider will recommend vaccines for you based on your age, medical history, and lifestyle or other factors, such as travel or where you work. What tests do I need? Screening Your health care provider may recommend screening tests for certain conditions. This may include: Lipid and cholesterol levels. Diabetes screening. This is done by checking your blood  sugar (glucose) after you have not eaten for a while (fasting). Pelvic exam and Pap test. Hepatitis B test. Hepatitis C test. HIV (human immunodeficiency virus) test. STI (sexually transmitted infection) testing, if you are at risk. Lung cancer screening. Colorectal cancer screening. Mammogram. Talk with your health care provider about when you should start having regular mammograms. This may depend on whether you have a family history of breast cancer. BRCA-related cancer screening. This may be done if you have a family history of breast, ovarian, tubal, or peritoneal cancers. Bone density scan. This is done to screen for osteoporosis. Talk with your health care provider about your test results, treatment options, and if necessary, the need for more tests. Follow these instructions at home: Eating and  drinking  Eat a diet that includes fresh fruits and vegetables, whole grains, lean protein, and low-fat dairy products. Take vitamin and mineral supplements as recommended by your health care provider. Do not drink alcohol if: Your health care provider tells you not to drink. You are pregnant, may be pregnant, or are planning to become pregnant. If you drink alcohol: Limit how much you have to 0-1 drink a day. Know how much alcohol is in your drink. In the [place] one drink equals one 12 oz bottle of beer (355 mL), one 5 oz glass of wine (148 mL), or one 1 oz glass of hard liquor (44 mL). Lifestyle Brush your teeth every morning and night with fluoride toothpaste. Floss one time each day. Exercise for at least 30 minutes 5 or more days each week. Do not use any products that contain nicotine or tobacco. These products include cigarettes, chewing tobacco, and vaping devices, such as e-cigarettes. If you need help quitting, ask your health care provider. Do not use drugs. If you are sexually active, practice safe sex. Use a condom or other form of protection to prevent STIs. If you do not wish to become pregnant, use a form of birth control. If you plan to become pregnant, see your health care provider for a prepregnancy visit. Take aspirin only as told by your health care provider. Make sure that you understand how much to take and what form to take. Work with your health care provider to find out whether it is safe and beneficial for you to take aspirin daily. Find healthy ways to manage stress, such as: Meditation, yoga, or listening to music. Journaling. Talking to a trusted person. Spending time with friends and family. Minimize exposure to UV radiation to reduce your risk of skin cancer. Safety Always wear your seat belt while driving or riding in a vehicle. Do not drive: If you have been drinking alcohol. Do not ride with someone who has been drinking. When you are tired or  distracted. While texting. If you have been using any mind-altering substances or drugs. Wear a helmet and other protective equipment during sports activities. If you have firearms in your house, make sure you follow all gun safety procedures. Seek help if you have been physically or sexually abused. What's next? Visit your health care provider once a year for an annual wellness visit. Ask your health care provider how often you should have your eyes and teeth checked. Stay up to date on all vaccines. This information is not intended to replace advice given to you by your health care provider. Make sure you discuss any questions you have with your health care provider. Document Revised: 03/23/2021 Document Reviewed: 03/23/2021 Elsevier Patient Education  [place].  Signed,   Merri Ray, MD [place] 05/01/22 3:04 PM

## 2022-05-01 NOTE — Patient Instructions (Signed)
Try taking Valtrex 500 mg daily for prevention, then if you do have a flare increase to 2 g once with repeat dose in 12 hours.  Then return to 500 mg daily.  If that does not slow down frequency of flares, let me know and we can potentially try 1000 mg daily.  I do recommend follow-up with eye specialist to recheck pressure in the eyes.  I placed a referral to ENT to look into other treatment options for sleep apnea.  Can follow-up with dentist to discuss mouthguard as well.  If increased depression symptoms, can increase to 1-1/2 pills/day.  Let me know if you make that change.  No other medication changes today.  I will let you know if there are any concerns on labs.  Thanks for coming in today and take care.  Preventive Care 52-14 Years Old, Female Preventive care refers to lifestyle choices and visits with your health care provider that can promote health and wellness. Preventive care visits are also called wellness exams. What can I expect for my preventive care visit? Counseling Your health care provider may ask you questions about your: Medical history, including: Past medical problems. Family medical history. Pregnancy history. Current health, including: Menstrual cycle. Method of birth control. Emotional well-being. Home life and relationship well-being. Sexual activity and sexual health. Lifestyle, including: Alcohol, nicotine or tobacco, and drug use. Access to firearms. Diet, exercise, and sleep habits. Work and work Statistician. Sunscreen use. Safety issues such as seatbelt and bike helmet use. Physical exam Your health care provider will check your: Height and weight. These may be used to calculate your BMI (body mass index). BMI is a measurement that tells if you are at a healthy weight. Waist circumference. This measures the distance around your waistline. This measurement also tells if you are at a healthy weight and may help predict your risk of certain diseases,  such as type 2 diabetes and high blood pressure. Heart rate and blood pressure. Body temperature. Skin for abnormal spots. What immunizations do I need?  Vaccines are usually given at various ages, according to a schedule. Your health care provider will recommend vaccines for you based on your age, medical history, and lifestyle or other factors, such as travel or where you work. What tests do I need? Screening Your health care provider may recommend screening tests for certain conditions. This may include: Lipid and cholesterol levels. Diabetes screening. This is done by checking your blood sugar (glucose) after you have not eaten for a while (fasting). Pelvic exam and Pap test. Hepatitis B test. Hepatitis C test. HIV (human immunodeficiency virus) test. STI (sexually transmitted infection) testing, if you are at risk. Lung cancer screening. Colorectal cancer screening. Mammogram. Talk with your health care provider about when you should start having regular mammograms. This may depend on whether you have a family history of breast cancer. BRCA-related cancer screening. This may be done if you have a family history of breast, ovarian, tubal, or peritoneal cancers. Bone density scan. This is done to screen for osteoporosis. Talk with your health care provider about your test results, treatment options, and if necessary, the need for more tests. Follow these instructions at home: Eating and drinking  Eat a diet that includes fresh fruits and vegetables, whole grains, lean protein, and low-fat dairy products. Take vitamin and mineral supplements as recommended by your health care provider. Do not drink alcohol if: Your health care provider tells you not to drink. You are pregnant, may be  pregnant, or are planning to become pregnant. If you drink alcohol: Limit how much you have to 0-1 drink a day. Know how much alcohol is in your drink. In the [place] one drink equals one 12 oz bottle of  beer (355 mL), one 5 oz glass of wine (148 mL), or one 1 oz glass of hard liquor (44 mL). Lifestyle Brush your teeth every morning and night with fluoride toothpaste. Floss one time each day. Exercise for at least 30 minutes 5 or more days each week. Do not use any products that contain nicotine or tobacco. These products include cigarettes, chewing tobacco, and vaping devices, such as e-cigarettes. If you need help quitting, ask your health care provider. Do not use drugs. If you are sexually active, practice safe sex. Use a condom or other form of protection to prevent STIs. If you do not wish to become pregnant, use a form of birth control. If you plan to become pregnant, see your health care provider for a prepregnancy visit. Take aspirin only as told by your health care provider. Make sure that you understand how much to take and what form to take. Work with your health care provider to find out whether it is safe and beneficial for you to take aspirin daily. Find healthy ways to manage stress, such as: Meditation, yoga, or listening to music. Journaling. Talking to a trusted person. Spending time with friends and family. Minimize exposure to UV radiation to reduce your risk of skin cancer. Safety Always wear your seat belt while driving or riding in a vehicle. Do not drive: If you have been drinking alcohol. Do not ride with someone who has been drinking. When you are tired or distracted. While texting. If you have been using any mind-altering substances or drugs. Wear a helmet and other protective equipment during sports activities. If you have firearms in your house, make sure you follow all gun safety procedures. Seek help if you have been physically or sexually abused. What's next? Visit your health care provider once a year for an annual wellness visit. Ask your health care provider how often you should have your eyes and teeth checked. Stay up to date on all vaccines. This  information is not intended to replace advice given to you by your health care provider. Make sure you discuss any questions you have with your health care provider. Document Revised: 03/23/2021 Document Reviewed: 03/23/2021 Elsevier Patient Education  [place].

## 2022-05-02 ENCOUNTER — Other Ambulatory Visit (INDEPENDENT_AMBULATORY_CARE_PROVIDER_SITE_OTHER): Payer: BC Managed Care – PPO

## 2022-05-02 DIAGNOSIS — R739 Hyperglycemia, unspecified: Secondary | ICD-10-CM | POA: Diagnosis not present

## 2022-05-02 DIAGNOSIS — Z808 Family history of malignant neoplasm of other organs or systems: Secondary | ICD-10-CM

## 2022-05-02 DIAGNOSIS — Z0000 Encounter for general adult medical examination without abnormal findings: Secondary | ICD-10-CM | POA: Diagnosis not present

## 2022-05-02 DIAGNOSIS — E785 Hyperlipidemia, unspecified: Secondary | ICD-10-CM

## 2022-05-02 DIAGNOSIS — Z1329 Encounter for screening for other suspected endocrine disorder: Secondary | ICD-10-CM

## 2022-05-02 DIAGNOSIS — I10 Essential (primary) hypertension: Secondary | ICD-10-CM

## 2022-05-02 LAB — LIPID PANEL
Cholesterol: 209 mg/dL — ABNORMAL HIGH (ref 0–200)
HDL: 51.4 mg/dL (ref >39.00)
LDL Cholesterol: 136 mg/dL — ABNORMAL HIGH (ref 0–99)
NonHDL: 157.39
Total CHOL/HDL Ratio: 4
Triglycerides: 108 mg/dL (ref 0.0–149.0)
VLDL: 21.6 mg/dL (ref 0.0–40.0)

## 2022-05-02 LAB — COMPREHENSIVE METABOLIC PANEL
ALT: 23 U/L (ref 0–35)
AST: 24 U/L (ref 0–37)
Albumin: 4.4 g/dL (ref 3.5–5.2)
Alkaline Phosphatase: 92 U/L (ref 39–117)
BUN: 17 mg/dL (ref 6–23)
CO2: 28 mEq/L (ref 19–32)
Calcium: 9.3 mg/dL (ref 8.4–10.5)
Chloride: 103 mEq/L (ref 96–112)
Creatinine, Ser: 0.9 mg/dL (ref 0.40–1.20)
GFR: 73.8 mL/min (ref >60.00)
Glucose, Bld: 96 mg/dL (ref 70–99)
Potassium: 4.6 mEq/L (ref 3.5–5.1)
Sodium: 139 mEq/L (ref 135–145)
Total Bilirubin: 0.5 mg/dL (ref 0.2–1.2)
Total Protein: 7.1 g/dL (ref 6.0–8.3)

## 2022-05-02 LAB — TSH: TSH: 1.81 u[IU]/mL (ref 0.35–5.50)

## 2022-05-02 LAB — HEMOGLOBIN A1C: Hgb A1c MFr Bld: 5.4 % (ref 4.6–6.5)

## 2022-12-21 ENCOUNTER — Ambulatory Visit
Admission: EM | Admit: 2022-12-21 | Discharge: 2022-12-21 | Disposition: A | Discharge status: Home / Self Care | Payer: BC Managed Care – PPO | Attending: Internal Medicine | Admitting: Internal Medicine

## 2022-12-21 DIAGNOSIS — R350 Frequency of micturition: Secondary | ICD-10-CM | POA: Diagnosis present

## 2022-12-21 DIAGNOSIS — N3001 Acute cystitis with hematuria: Secondary | ICD-10-CM | POA: Insufficient documentation

## 2022-12-21 DIAGNOSIS — R300 Dysuria: Secondary | ICD-10-CM | POA: Insufficient documentation

## 2022-12-21 LAB — POCT URINALYSIS DIP (MANUAL ENTRY)
Bilirubin, UA: NEGATIVE
Blood, UA: NEGATIVE
Glucose, UA: NEGATIVE mg/dL
Ketones, POC UA: NEGATIVE mg/dL
Nitrite, UA: POSITIVE — AB
Protein Ur, POC: NEGATIVE mg/dL
Spec Grav, UA: 1.015 (ref 1.010–1.025)
Urobilinogen, UA: 0.2 E.U./dL
pH, UA: 7 (ref 5.0–8.0)

## 2022-12-21 MED ORDER — NITROFURANTOIN MONOHYD MACRO 100 MG PO CAPS: 100.0000 mg | ORAL_CAPSULE | Freq: Two times a day (BID) | ORAL | 0 refills | Status: DC

## 2022-12-21 NOTE — ED Triage Notes (Signed)
Pt states back pain,blood in urine and burning with urination for the past week. Has been taking AZO at home.

## 2022-12-21 NOTE — Discharge Instructions (Addendum)
It appears that you have a urinary tract infection so I am treating this with antibiotic.  Urine culture is pending.  Will call if it is abnormal.  Follow-up if any symptoms persist or worsen.

## 2022-12-21 NOTE — ED Provider Notes (Signed)
[facility]    CSN: [id number] Arrival date & time: 12/21/22  1813      History   Chief Complaint Chief Complaint  Patient presents with   Dysuria    HPI Hannah Buchanan is a 52 y.o. female.   Patient presents with hematuria, mild bilateral back pain, dysuria, urinary frequency that started about a week ago.  She has had 2 episodes of hematuria when wiping.  Denies abnormal vaginal bleeding, pelvic pain, vaginal discharge, fever.  Does report some mild lower abdominal cramping.  Has taken Azo for symptoms.  Patient denies exposure or concern for STD.  Denies history of recurrent urinary tract infections.     Dysuria   Past Medical History:  Diagnosis Date   Allergy    Claritin daily yearround.   Arthritis    per pt   Depression    Esophageal stricture    GERD (gastroesophageal reflux disease)    Hiatal hernia    Hypertension    Pneumonia    Recurrent cold sores    Status post dilation of esophageal narrowing     Patient Active Problem List   Diagnosis Date Noted   Allergic rhinitis due to pollen 11/18/2014   Essential hypertension, benign 11/18/2014   GERD 11/08/2009   OTHER DYSPHAGIA 11/08/2009   FLATULENCE ERUCTATION AND GAS PAIN 11/08/2009    Past Surgical History:  Procedure Laterality Date   Admission  10/09/1998   prolonged syncopal event; admission for 24 hours.  [facility]   BLADDER REPAIR  1998   David Lowe/Gynecology.   BREAST SURGERY  1999   breast reduction   KNEE ARTHROSCOPY Right    10 yrs ago   UPPER GASTROINTESTINAL ENDOSCOPY  2018    OB History   No obstetric history on file.      Home Medications    Prior to Admission medications   Medication Sig Start Date End Date Taking? Authorizing Provider  nitrofurantoin, macrocrystal-monohydrate, (MACROBID) 100 MG capsule Take 1 capsule (100 mg total) by mouth 2 (two) times daily. 12/21/22  Yes Eligio Angert, Hildred Alamin E, FNP  chlorthalidone (HYGROTON) 25 MG tablet TAKE 1/2 TABLET(12.5 MG)  BY MOUTH DAILY Patient taking differently: Take 0.5 tablets by mouth as needed. 01/24/22   Wendie Agreste, MD  Cholecalciferol (VITAMIN D-3 PO) Vitamin D3    [provider]  docusate sodium (COLACE) 100 MG capsule Take 100 mg by mouth as needed.     [provider]  loratadine (CLARITIN) 10 MG tablet Take 10 mg by mouth daily.    [provider]  meloxicam (MOBIC) 15 MG tablet 7.5 mg as needed.  05/08/18   [provider]  metoprolol succinate (TOPROL-XL) 25 MG 24 hr tablet Take 1.5 tablets (37.5 mg total) by mouth daily. TAKE 1.5 TABLET BY MOUTH ONCE DAILY 05/01/22   Wendie Agreste, MD  Misc Natural Products (NEURIVA PO)     [provider]  Omega-3 Fatty Acids (FISH OIL PO) Fish Oil 1,200 mg (144 mg-216 mg) capsule    [provider]  Probiotic Product (PROBIOTIC DAILY PO) Take 1 tablet by mouth daily.    [provider]  sertraline (ZOLOFT) 100 MG tablet Take 100 mg by mouth daily. 01/23/22   [provider]  valACYclovir (VALTREX) 500 MG tablet Take 500 mg by mouth as needed.    [provider]    Family History Family History  Problem Relation Age of Onset   Arthritis Mother  Asthma Mother    Colon polyps Father    Diabetes Father    Arthritis Father        TKR B   Kidney disease Father    Colon cancer Father 11   Depression Sister    Asthma Sister    Migraines Sister    Alcohol abuse Brother    Depression Brother    Thyroid cancer Maternal Aunt    Cancer Maternal Uncle        unknown origin-Mets   Esophageal cancer Maternal Uncle 55   Uterine cancer Maternal Grandmother    Stomach cancer Neg Hx    Rectal cancer Neg Hx    Sleep apnea Neg Hx     Social History Social History   Tobacco Use   Smoking status: Never   Smokeless tobacco: Never  Vaping Use   Vaping Use: Never used  Substance Use Topics   Alcohol use: Yes    Comment: occasional   Drug use: No     Allergies    Gluten meal and Wheat   Review of Systems Review of Systems Per HPI  Physical Exam Triage Vital Signs ED Triage Vitals  Enc Vitals Group     BP 12/21/22 1837 131/81     Pulse Rate 12/21/22 1837 64     Resp 12/21/22 1837 16     Temp 12/21/22 1837 98.2 F (36.8 C)     Temp Source 12/21/22 1837 Oral     SpO2 12/21/22 1837 96 %     Weight --      Height --      Head Circumference --      Peak Flow --      Pain Score 12/21/22 1838 2     Pain Loc --      Pain Edu? --      Excl. in [place]? --    No data found.  Updated Vital Signs BP 131/81 (BP Location: Left Arm)   Pulse 64   Temp 98.2 F (36.8 C) (Oral)   Resp 16   LMP 04/16/2018 (Approximate)   SpO2 96%   Visual Acuity Right Eye Distance:   Left Eye Distance:   Bilateral Distance:    Right Eye Near:   Left Eye Near:    Bilateral Near:     Physical Exam Constitutional:      General: She is not in acute distress.    Appearance: Normal appearance. She is not toxic-appearing or diaphoretic.  HENT:     Head: Normocephalic and atraumatic.  Eyes:     Extraocular Movements: Extraocular movements intact.     Conjunctiva/sclera: Conjunctivae normal.  Pulmonary:     Effort: Pulmonary effort is normal.  Abdominal:     General: Bowel sounds are normal.     Palpations: Abdomen is soft.     Tenderness: There is no right CVA tenderness or left CVA tenderness.     Comments: Mild lower abdominal suprapubic and right lower quadrant pain with palpation.   Neurological:     General: No focal deficit present.     Mental Status: She is alert and oriented to person, place, and time. Mental status is at baseline.  Psychiatric:        Mood and Affect: Mood normal.        Behavior: Behavior normal.        Thought Content: Thought content normal.        Judgment: Judgment normal.      UC  Treatments / Results  Labs (all labs ordered are listed, but only abnormal results are displayed) Labs Reviewed  POCT URINALYSIS DIP  (MANUAL ENTRY) - Abnormal; Notable for the following components:      Result Value   Nitrite, UA Positive (*)    Leukocytes, UA Trace (*)    All other components within normal limits  URINE CULTURE    EKG   Radiology No results found.  Procedures Procedures (including critical care time)  Medications Ordered in UC Medications - No data to display  Initial Impression / Assessment and Plan / UC Course  I have reviewed the triage vital signs and the nursing notes.  Pertinent labs & imaging results that were available during my care of the patient were reviewed by me and considered in my medical decision making (see chart for details).     UA indicating urinary tract infection especially with associated urinary symptoms. No concern for kidney stone at this time. Will opt to treat with Macrobid antibiotic.  Urine culture pending.  Advised supportive care and follow-up precautions.  Patient verbalized understanding and was agreeable with plan. Final Clinical Impressions(s) / UC Diagnoses   Final diagnoses:  Acute cystitis with hematuria  Dysuria  Urinary frequency     Discharge Instructions      It appears that you have a urinary tract infection so I am treating this with antibiotic.  Urine culture is pending.  Will call if it is abnormal.  Follow-up if any symptoms persist or worsen.    ED Prescriptions     Medication Sig Dispense Auth. Provider   nitrofurantoin, macrocrystal-monohydrate, (MACROBID) 100 MG capsule Take 1 capsule (100 mg total) by mouth 2 (two) times daily. 10 capsule Teodora Medici, [place]      PDMP not reviewed this encounter.   Teodora Medici, [place] 12/21/22 [phone]

## 2022-12-23 LAB — URINE CULTURE: Culture: <10000 — AB

## 2023-01-17 ENCOUNTER — Other Ambulatory Visit: Payer: Self-pay | Admitting: Family Medicine

## 2023-01-17 DIAGNOSIS — I10 Essential (primary) hypertension: Secondary | ICD-10-CM

## 2023-01-17 DIAGNOSIS — R600 Localized edema: Secondary | ICD-10-CM

## 2023-03-02 ENCOUNTER — Ambulatory Visit
Admission: EM | Admit: 2023-03-02 | Discharge: 2023-03-02 | Disposition: A | Discharge status: Home / Self Care | Payer: BC Managed Care – PPO | Attending: Family Medicine | Admitting: Family Medicine

## 2023-03-02 ENCOUNTER — Encounter: Payer: Self-pay | Admitting: Family Medicine

## 2023-03-02 ENCOUNTER — Encounter: Payer: Self-pay | Admitting: Emergency Medicine

## 2023-03-02 DIAGNOSIS — R195 Other fecal abnormalities: Secondary | ICD-10-CM

## 2023-03-02 DIAGNOSIS — R1084 Generalized abdominal pain: Secondary | ICD-10-CM | POA: Diagnosis not present

## 2023-03-02 LAB — POC HEMOCCULT BLD/STL (OFFICE/1-CARD/DIAGNOSTIC): Fecal Occult Blood, POC: NEGATIVE

## 2023-03-02 NOTE — ED Triage Notes (Signed)
Monday began with diarrhea and stomach pain and took Pepto bismol then Wednesday black tarry stool, Thursday black tarry stools, Today dr said needed to be seen. Some upper abdominal pain today and still black tarry stool and she said very abnormal smell.

## 2023-03-02 NOTE — Telephone Encounter (Signed)
Sorry to hear about her symptoms.  If she has been using Pepto-Bismol that can sometimes cause dark stools but would recommend emergency room or at the minimum urgent care evaluation today if unable to be seen by provider to make sure there is no blood in the stool as dark tarry stools can sometimes indicate intestinal bleeding.

## 2023-03-02 NOTE — ED Provider Notes (Signed)
[facility]    CSN: [id number] Arrival date & time: 03/02/23  1637      History   Chief Complaint Chief Complaint  Patient presents with   Abdominal Pain    HPI Hannah Buchanan is a 52 y.o. female.   HPI Patient in for evaluation per her PCP advice due to 3 days of dark tarry stools and generalized abdominal pain. Patient reports that she contacted her primary care provider provider to notify him that she had been having 3 days of consecutive dark tarry stools and advised her to come in for evaluation.  Patient reports that she has had a colonoscopy in the past however her father had died of colon cancer.  She reports that her colonoscopy took place around the age of 52 and she does not recall if she required any additional follow-up following that procedure.  Reports initially having an episode of upset stomach and diarrheal stools.  She did treat the symptoms with Pepto-Bismol and they resolved however then she subsequently developed a generalized bloating and abdominal pain and noticed that her stools had become darkening.  She also endorses that her appetite has changed she has felt slightly more for with only a few bites of meals.  She continues to drink fluids at baseline.  She denies any recent nausea or vomiting associated with the changes in stools.  Past Medical History:  Diagnosis Date   Allergy    Claritin daily yearround.   Arthritis    per pt   Depression    Esophageal stricture    GERD (gastroesophageal reflux disease)    Hiatal hernia    Hypertension    Pneumonia    Recurrent cold sores    Status post dilation of esophageal narrowing     Patient Active Problem List   Diagnosis Date Noted   Allergic rhinitis due to pollen 11/18/2014   Essential hypertension, benign 11/18/2014   GERD 11/08/2009   OTHER DYSPHAGIA 11/08/2009   FLATULENCE ERUCTATION AND GAS PAIN 11/08/2009    Past Surgical History:  Procedure Laterality Date   Admission  10/09/1998    prolonged syncopal event; admission for 24 hours.  [facility]   BLADDER REPAIR  1998   David Lowe/Gynecology.   BREAST SURGERY  1999   breast reduction   KNEE ARTHROSCOPY Right    10 yrs ago   UPPER GASTROINTESTINAL ENDOSCOPY  2018    OB History   No obstetric history on file.      Home Medications    Prior to Admission medications   Medication Sig Start Date End Date Taking? Authorizing Provider  chlorthalidone (HYGROTON) 25 MG tablet TAKE 1/2 TABLET(12.5 MG) BY MOUTH DAILY 01/17/23   Shade Flood, MD  Cholecalciferol (VITAMIN D-3 PO) Vitamin D3    [provider]  docusate sodium (COLACE) 100 MG capsule Take 100 mg by mouth as needed.     [provider]  loratadine (CLARITIN) 10 MG tablet Take 10 mg by mouth daily.    [provider]  meloxicam (MOBIC) 15 MG tablet 7.5 mg as needed.  05/08/18   [provider]  metoprolol succinate (TOPROL-XL) 25 MG 24 hr tablet Take 1.5 tablets (37.5 mg total) by mouth daily. TAKE 1.5 TABLET BY MOUTH ONCE DAILY 05/01/22   Shade Flood, MD  Misc Natural Products (NEURIVA PO)     [provider]  nitrofurantoin, macrocrystal-monohydrate, (MACROBID) 100 MG capsule Take 1 capsule (100 mg total) by mouth 2 (  two) times daily. 12/21/22   Gustavus Bryant, FNP  Omega-3 Fatty Acids (FISH OIL PO) Fish Oil 1,200 mg (144 mg-216 mg) capsule    [provider]  Probiotic Product (PROBIOTIC DAILY PO) Take 1 tablet by mouth daily.    [provider]  sertraline (ZOLOFT) 100 MG tablet Take 100 mg by mouth daily. 01/23/22   [provider]  valACYclovir (VALTREX) 500 MG tablet Take 500 mg by mouth as needed.    [provider]    Family History Family History  Problem Relation Age of Onset   Arthritis Mother    Asthma Mother    Colon polyps Father    Diabetes Father    Arthritis Father        TKR B   Kidney disease Father    Colon cancer Father 57   Depression Sister     Asthma Sister    Migraines Sister    Alcohol abuse Brother    Depression Brother    Thyroid cancer Maternal Aunt    Cancer Maternal Uncle        unknown origin-Mets   Esophageal cancer Maternal Uncle 55   Uterine cancer Maternal Grandmother    Stomach cancer Neg Hx    Rectal cancer Neg Hx    Sleep apnea Neg Hx     Social History Social History   Tobacco Use   Smoking status: Never   Smokeless tobacco: Never  Vaping Use   Vaping Use: Never used  Substance Use Topics   Alcohol use: Yes    Comment: occasional   Drug use: No     Allergies   Gluten meal and Wheat  Review of Systems Review of Systems Pertinent negatives listed in HPI   Physical Exam Triage Vital Signs ED Triage Vitals  Enc Vitals Group     BP 03/02/23 1756 126/84     Pulse Rate 03/02/23 1756 66     Resp 03/02/23 1756 16     Temp 03/02/23 1756 97.8 F (36.6 C)     Temp Source 03/02/23 1756 Oral     SpO2 03/02/23 1756 97 %     Weight --      Height --      Head Circumference --      Peak Flow --      Pain Score 03/02/23 1755 1     Pain Loc --      Pain Edu? --      Excl. in GC? --    No data found.  Updated Vital Signs BP 126/84 (BP Location: Left Arm)   Pulse 66   Temp 97.8 F (36.6 C) (Oral)   Resp 16   LMP 04/16/2018 (Approximate)   SpO2 97%   Visual Acuity Right Eye Distance:   Left Eye Distance:   Bilateral Distance:    Right Eye Near:   Left Eye Near:    Bilateral Near:     Physical Exam Vitals reviewed. Exam conducted with a chaperone present.  Constitutional:      Appearance: She is well-developed.  HENT:     Head: Normocephalic.  Eyes:     Extraocular Movements: Extraocular movements intact.     Pupils: Pupils are equal, round, and reactive to light.  Cardiovascular:     Rate and Rhythm: Normal rate and regular rhythm.  Abdominal:     General: Abdomen is protuberant. Bowel sounds are increased. There is distension.     Tenderness: There is generalized  abdominal tenderness. There is no right CVA tenderness, left CVA tenderness, guarding or rebound.  Neurological:     General: No focal deficit present.     Mental Status: She is alert.     UC Treatments / Results  Labs (all labs ordered are listed, but only abnormal results are displayed) Labs Reviewed  POC HEMOCCULT BLD/STL (OFFICE/1-CARD/DIAGNOSTIC)    EKG   Radiology No results found.  Procedures Procedures (including critical care time)  Medications Ordered in UC Medications - No data to display  Initial Impression / Assessment and Plan / UC Course  I have reviewed the triage vital signs and the nursing notes.  Pertinent labs & imaging results that were available during my care of the patient were reviewed by me and considered in my medical decision making (see chart for details).    Guaiac test negative for blood. Generalized Abdominal Pain , no rebound tenderness/guarding, significant bloating on exam. Low suspicion for an acute abdomen. Pt is nontoxic appearing.  Recommend follow-up outpatient with primary care provider to discuss referral to GI.  Family history of father with colon cancer patient is likely need of a repeat colonoscopy given that it has been greater than 5 years.  Courage patient to hydrate well with water and limiting foods high in acidity and spices.  ED precautions given if any of her symptoms worsen prior to following up with her primary care doctor.  Patient verbalized understanding and agreement with plan. Final Clinical Impressions(s) / UC Diagnoses   Final diagnoses:  Dark stools  Generalized abdominal pain     Discharge Instructions      Stool card showed no signs of bleeding. I would recommend follow-up with Dr. Chilton Si next week if symptoms have not resolved as she likely may need to be further evaluated by a GI specialist.  If your abdominal pain becomes severe I would recommend emergent evaluation in the emergency department. For now we  will just continue to hydrate well with water limit foods high in acid which could increase bloating.     ED Prescriptions   None    PDMP not reviewed this encounter.   Bing Neighbors, NP 03/05/23 [phone]

## 2023-03-02 NOTE — Telephone Encounter (Signed)
Called patient and advised she is going to go to Urgent care today as she notes she did take Pepto Monday but has had more than 3 movements that are black and tarry she will call and follow up next week

## 2023-03-02 NOTE — Telephone Encounter (Signed)
Pt asking what she can do for diarrhea then black tarry stools  I advised an appointment for next week but with the long weekend is there anything else we can advise as well?

## 2023-03-02 NOTE — Discharge Instructions (Signed)
Stool card showed no signs of bleeding. I would recommend follow-up with Dr. Chilton Si next week if symptoms have not resolved as she likely may need to be further evaluated by a GI specialist.  If your abdominal pain becomes severe I would recommend emergent evaluation in the emergency department. For now we will just continue to hydrate well with water limit foods high in acid which could increase bloating.

## 2023-04-26 LAB — HM MAMMOGRAPHY

## 2023-05-03 ENCOUNTER — Ambulatory Visit (INDEPENDENT_AMBULATORY_CARE_PROVIDER_SITE_OTHER): Payer: BC Managed Care – PPO | Admitting: Family Medicine

## 2023-05-03 ENCOUNTER — Encounter: Payer: Self-pay | Admitting: Family Medicine

## 2023-05-03 VITALS — BP 120/62 | HR 72 | Temp 97.9°F | Ht 63.5 in | Wt 238.0 lb

## 2023-05-03 DIAGNOSIS — I10 Essential (primary) hypertension: Secondary | ICD-10-CM | POA: Diagnosis not present

## 2023-05-03 DIAGNOSIS — Z23 Encounter for immunization: Secondary | ICD-10-CM | POA: Diagnosis not present

## 2023-05-03 DIAGNOSIS — R739 Hyperglycemia, unspecified: Secondary | ICD-10-CM

## 2023-05-03 DIAGNOSIS — G4733 Obstructive sleep apnea (adult) (pediatric): Secondary | ICD-10-CM

## 2023-05-03 DIAGNOSIS — Z0001 Encounter for general adult medical examination with abnormal findings: Secondary | ICD-10-CM | POA: Diagnosis not present

## 2023-05-03 DIAGNOSIS — E785 Hyperlipidemia, unspecified: Secondary | ICD-10-CM | POA: Diagnosis not present

## 2023-05-03 DIAGNOSIS — Z131 Encounter for screening for diabetes mellitus: Secondary | ICD-10-CM | POA: Diagnosis not present

## 2023-05-03 DIAGNOSIS — R600 Localized edema: Secondary | ICD-10-CM

## 2023-05-03 DIAGNOSIS — Z0000 Encounter for general adult medical examination without abnormal findings: Secondary | ICD-10-CM

## 2023-05-03 LAB — CBC
HCT: 42.1 % (ref 36.0–46.0)
Hemoglobin: 13.9 g/dL (ref 12.0–15.0)
MCHC: 33.1 g/dL (ref 30.0–36.0)
MCV: 90.1 fl (ref 78.0–100.0)
Platelets: 191 10*3/uL (ref 150.0–400.0)
RBC: 4.67 Mil/uL (ref 3.87–5.11)
RDW: 13.7 % (ref 11.5–15.5)
WBC: 7.5 10*3/uL (ref 4.0–10.5)

## 2023-05-03 LAB — COMPREHENSIVE METABOLIC PANEL
ALT: 29 U/L (ref 0–35)
AST: 24 U/L (ref 0–37)
Albumin: 4.7 g/dL (ref 3.5–5.2)
Alkaline Phosphatase: 80 U/L (ref 39–117)
BUN: 16 mg/dL (ref 6–23)
CO2: 33 mEq/L — ABNORMAL HIGH (ref 19–32)
Calcium: 10.1 mg/dL (ref 8.4–10.5)
Chloride: 101 mEq/L (ref 96–112)
Creatinine, Ser: 0.81 mg/dL (ref 0.40–1.20)
GFR: 83.16 mL/min (ref >60.00)
Glucose, Bld: 93 mg/dL (ref 70–99)
Potassium: 4.3 mEq/L (ref 3.5–5.1)
Sodium: 141 mEq/L (ref 135–145)
Total Bilirubin: 0.6 mg/dL (ref 0.2–1.2)
Total Protein: 7.6 g/dL (ref 6.0–8.3)

## 2023-05-03 LAB — LIPID PANEL
Cholesterol: 223 mg/dL — ABNORMAL HIGH (ref 0–200)
HDL: 55.5 mg/dL (ref >39.00)
LDL Cholesterol: 145 mg/dL — ABNORMAL HIGH (ref 0–99)
NonHDL: 167.96
Total CHOL/HDL Ratio: 4
Triglycerides: 117 mg/dL (ref 0.0–149.0)
VLDL: 23.4 mg/dL (ref 0.0–40.0)

## 2023-05-03 LAB — HEMOGLOBIN A1C: Hgb A1c MFr Bld: 5.3 % (ref 4.6–6.5)

## 2023-05-03 MED ORDER — METOPROLOL SUCCINATE ER 25 MG PO TB24: 25.0000 mg | ORAL_TABLET | Freq: Every day | ORAL | 2 refills | Status: DC

## 2023-05-03 MED ORDER — CHLORTHALIDONE 25 MG PO TABS: 25.0000 mg | ORAL_TABLET | Freq: Every day | ORAL | 1 refills | Status: AC

## 2023-05-03 NOTE — Progress Notes (Signed)
Subjective:  Patient ID: Hannah Buchanan, female    DOB: 1971/03/20  Age: 52 y.o. MRN: [id number]  CC:  Chief Complaint  Patient presents with   Annual Exam    Pt is fasting     HPI Kiante CLARABEL MARION presents for Annual Exam PCP, [facility], Dr. Henderson Cloud, past few weeks. Next pap due next year. Mammogram last week.  Ortho, [facility], right knee osteoarthritis, left foot pain.. Dr. Penni Bombard or Roma Schanz, [place]   Hypertension: Chlorthalidone 12.5 mg daily.  Toprol-XL 37.5mg  daily. No new med side effects. Rare lightheadedness with standing - has had syncope years ago. Last flare of dizziness with standing last week, no injury.  History obstructive sleep apnea, AHI 23 , unable to tolerate CPAP.  Option of ENT eval with history of nasal polyp or dental appliance, sleeps with head of bed elevated occasionally.  Not waking up during night.  Plans to discuss options at upcoming dental visit.  Home readings: 120/60's. Palpitations in past - managed with toprol.   BP Readings from Last 3 Encounters:  05/03/23 120/62  03/02/23 126/84  12/21/22 131/81   Lab Results  Component Value Date   CREATININE 0.90 05/02/2022   History of cold sore/fever blisters Treated with Valtrex, triggers: stress/sun/sickness. One currently. Occur every other month. Less with magnesium supplement. HA with daily valtrex.   Allergic rhinitis Treated with Claritin. Working well.   Hyperlipidemia: No current meds. Mild hyperglycemia 104 in 2021 Lab Results  Component Value Date   CHOL 209 (H) 05/02/2022   HDL 51.40 05/02/2022   LDLCALC 136 (H) 05/02/2022   TRIG 108.0 05/02/2022   CHOLHDL 4 05/02/2022   Lab Results  Component Value Date   ALT 23 05/02/2022   AST 24 05/02/2022   ALKPHOS 92 05/02/2022   BILITOT 0.5 05/02/2022   Depression Treated with sertraline 100 mg daily in past - stopped in Spring - doing well off med.  No withdrawal sx's - tapered off.     05/03/2023   10:48 AM 05/03/2023    10:45 AM 05/01/2022    2:12 PM 10/31/2021    9:09 AM 07/15/2021   10:53 AM  Depression screen PHQ 2/9  Decreased Interest 0 0 0 0 0  Down, Depressed, Hopeless 0  0 0 0  PHQ - 2 Score 0 0 0 0 0  Altered sleeping 2   1 0  Tired, decreased energy 1   2 2   Change in appetite 1   0 1  Feeling bad or failure about yourself  0   0 0  Trouble concentrating 1   0 0  Moving slowly or fidgety/restless 0   0 0  Suicidal thoughts 0   0 0  PHQ-9 Score 5   3 3   Difficult doing work/chores    Not difficult at all     Health Maintenance  Topic Date Due   DTaP/Tdap/Td (2 - Td or Tdap) 12/14/2019   MAMMOGRAM  Never done   Zoster Vaccines- Shingrix (1 of 2) Never done   INFLUENZA VACCINE  05/10/2023   PAP SMEAR-Modifier  12/27/2023   Colonoscopy  12/02/2030   Hepatitis C Screening  Completed   HIV Screening  Completed   HPV VACCINES  Aged Out   COVID-19 Vaccine  Discontinued  Colonoscopy 11/28/2020, Dr. Russella Dar, mild diverticulosis, repeat 5 years (FH of colon CA - father) Followed by GYN for mammogram and Pap testing   Immunization History  Administered Date(s) Administered  Tdap 12/13/2009  Declines COVID and shingles vaccines. Tdap - today.   No results found. Optho - due, 1 year ago.   Dental:scheduled in few weeks.   Alcohol: rare  Tobacco: none  Exercise: 2-3 times per week with PT and home exercises. 3-5 hours per week.  Wt Readings from Last 3 Encounters:  05/03/23 238 lb (108 kg)  05/01/22 233 lb 9.6 oz (106 kg)  04/20/22 235 lb (106.6 kg)   Lab Results  Component Value Date   HGBA1C 5.4 05/02/2022    History Patient Active Problem List   Diagnosis Date Noted   Allergic rhinitis due to pollen 11/18/2014   Essential hypertension, benign 11/18/2014   GERD 11/08/2009   OTHER DYSPHAGIA 11/08/2009   FLATULENCE ERUCTATION AND GAS PAIN 11/08/2009   Past Medical History:  Diagnosis Date   Allergy    Claritin daily yearround.   Arthritis    per pt   Depression     Esophageal stricture    GERD (gastroesophageal reflux disease)    Hiatal hernia    Hypertension    Pneumonia    Recurrent cold sores    Status post dilation of esophageal narrowing    Past Surgical History:  Procedure Laterality Date   Admission  10/09/1998   prolonged syncopal event; admission for 24 hours.  [facility]   BLADDER REPAIR  1998   [facility]/Gynecology.   BREAST SURGERY  1999   breast reduction   KNEE ARTHROSCOPY Right    10 yrs ago   UPPER GASTROINTESTINAL ENDOSCOPY  2018   Allergies  Allergen Reactions   Gluten Meal Other (See Comments)   Wheat    Prior to Admission medications   Medication Sig Start Date End Date Taking? Authorizing Provider  chlorthalidone (HYGROTON) 25 MG tablet TAKE 1/2 TABLET(12.5 MG) BY MOUTH DAILY 01/17/23  Yes Shade Flood, MD  Cholecalciferol (VITAMIN D-3 PO) PRN   Yes [provider]  docusate sodium (COLACE) 100 MG capsule Take 100 mg by mouth as needed for mild constipation. PRN   Yes [provider]  loratadine (CLARITIN) 10 MG tablet Take 10 mg by mouth daily.   Yes [provider]  meloxicam (MOBIC) 15 MG tablet 7.5 mg as needed.  05/08/18  Yes [provider]  metoprolol succinate (TOPROL-XL) 25 MG 24 hr tablet Take 1.5 tablets (37.5 mg total) by mouth daily. TAKE 1.5 TABLET BY MOUTH ONCE DAILY 05/01/22  Yes Shade Flood, MD  Misc Natural Products (NEURIVA PO)    Yes [provider]  Omega-3 Fatty Acids (FISH OIL PO) Fish Oil 1,200 mg (144 mg-216 mg) capsule   Yes [provider]  Probiotic Product (PROBIOTIC DAILY PO) Take 1 tablet by mouth daily.   Yes [provider]  valACYclovir (VALTREX) 500 MG tablet Take 500 mg by mouth as needed.   Yes [provider]  sertraline (ZOLOFT) 100 MG tablet Take 100 mg by mouth daily. Patient not taking: Reported on 05/03/2023 01/23/22   [provider]   Social History   Socioeconomic History   Marital  status: Married    Spouse name: Not on file   Number of children: 2   Years of education: Not on file   Highest education level: Not on file  Occupational History   Occupation: medical assistant    Employer: [employer]  Tobacco Use   Smoking status: Never   Smokeless tobacco: Never  Vaping Use   Vaping status: Never Used  Substance and Sexual Activity   Alcohol use: Yes    Comment: occasional   Drug use: No   Sexual activity: Yes  Other Topics Concern   Not on file  Social History Narrative   Marital status: married x 26 years; happily      Chldren: 2 children (22, 25); no grandchldren.      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   Social Determinants of Health   Financial Resource Strain: Not on file  Food Insecurity: Not on file  Transportation Needs: Not on file  Physical Activity: Not on file  Stress: Not on file  Social Connections: Not on file  Intimate Partner Violence: Not on file    Review of Systems  13 point review of systems per patient health survey noted.  Negative other than as indicated above or in HPI.   Objective:   Vitals:   05/03/23 1114  BP: 120/62  Pulse: 72  Temp: 97.9 F (36.6 C)  TempSrc: Temporal  SpO2: 97%  Weight: 238 lb (108 kg)  Height: 5' 3.5" (1.613 m)     Physical Exam Vitals reviewed.  Constitutional:      Appearance: Normal appearance. She is well-developed.  HENT:     Head: Normocephalic and atraumatic.  Eyes:     Conjunctiva/sclera: Conjunctivae normal.     Pupils: Pupils are equal, round, and reactive to light.  Neck:     Vascular: No carotid bruit.  Cardiovascular:     Rate and Rhythm: Normal rate and regular rhythm.     Heart sounds: Normal heart sounds.  Pulmonary:     Effort: Pulmonary effort  is normal.     Breath sounds: Normal breath sounds.  Abdominal:     Palpations: Abdomen is soft. There is no pulsatile mass.     Tenderness: There is no abdominal tenderness.  Musculoskeletal:     Right lower leg: Right lower leg edema: trace bilat..     Left lower leg: Edema present.  Skin:    General: Skin is warm and dry.  Neurological:     Mental Status: She is alert and oriented to person, place, and time.  Psychiatric:        Mood and Affect: Mood normal.        Behavior: Behavior normal.       Assessment & Plan:  GENA LASKI is a 52 y.o. female . Annual physical exam  - -anticipatory guidance as below in AVS, screening labs above. Health maintenance items as above in HPI discussed/recommended as applicable.   -Depression well-controlled off SSRI with option to restart if symptoms recur.  Essential hypertension - Plan: chlorthalidone (HYGROTON) 25 MG tablet, metoprolol succinate (TOPROL-XL) 25 MG 24 hr tablet, Comprehensive metabolic panel, CBC -Well-controlled, possible component of orthostasis.  Decrease Toprol dose to 25 mg for now, continue same dose chlorthalidone, update on symptoms next few weeks with potential further adjustments.  RTC precautions  Pedal edema - Plan: chlorthalidone (HYGROTON) 25 MG tablet  -Stable, continue chlorthalidone.  Screening for diabetes mellitus - Plan: Hemoglobin A1c  Hyperglycemia - Plan: Comprehensive metabolic panel, Hemoglobin A1c  OSA (obstructive sleep apnea)  -Plans to discuss  with dentist, also recommended follow-up with sleep specialist to consider other treatments.  Hyperlipidemia, unspecified hyperlipidemia type - Plan: Lipid panel  -Check labs with adjustment of plan accordingly. Need for Tdap vaccination - Plan: Tdap vaccine greater than or equal to 7yo IM   Meds ordered this encounter  Medications   chlorthalidone (HYGROTON) 25 MG tablet    Sig: Take 1 tablet (25 mg total) by mouth daily.    Dispense:  45  tablet    Refill:  1   metoprolol succinate (TOPROL-XL) 25 MG 24 hr tablet    Sig: Take 1 tablet (25 mg total) by mouth daily. TAKE 1.5 TABLET BY MOUTH ONCE DAILY    Dispense:  90 tablet    Refill:  2   Patient Instructions  Try decrease of toprol to 25mg  every day. Update by mychart in next few weeks. Stand up slowly if possible after prolonged seating. If lightheadedness continues we may need to adjust meds further.   I will check some other labs and let me know if there are concerns.  Let me know if any other medication refills are needed.  Can discuss the dental device with dental visit but could also follow back up with sleep specialist for other options if needed as well for OSA.  Let me know if I can help. Take care and thanks for coming today.  Preventive Care 102-77 Years Old, Female Preventive care refers to lifestyle choices and visits with your health care provider that can promote health and wellness. Preventive care visits are also called wellness exams. What can I expect for my preventive care visit? Counseling Your health care provider may ask you questions about your: Medical history, including: Past medical problems. Family medical history. Pregnancy history. Current health, including: Menstrual cycle. Method of birth control. Emotional well-being. Home life and relationship well-being. Sexual activity and sexual health. Lifestyle, including: Alcohol, nicotine or tobacco, and drug use. Access to firearms. Diet, exercise, and sleep habits. Work and work Astronomer. Sunscreen use. Safety issues such as seatbelt and bike helmet use. Physical exam Your health care provider will check your: Height and weight. These may be used to calculate your BMI (body mass index). BMI is a measurement that tells if you are at a healthy weight. Waist circumference. This measures the distance around your waistline. This measurement also tells if you are at a healthy weight and may  help predict your risk of certain diseases, such as type 2 diabetes and high blood pressure. Heart rate and blood pressure. Body temperature. Skin for abnormal spots. What immunizations do I need?  Vaccines are usually given at various ages, according to a schedule. Your health care provider will recommend vaccines for you based on your age, medical history, and lifestyle or other factors, such as travel or where you work. What tests do I need? Screening Your health care provider may recommend screening tests for certain conditions. This may include: Lipid and cholesterol levels. Diabetes screening. This is done by checking your blood sugar (glucose) after you have not eaten for a while (fasting). Pelvic exam and Pap test. Hepatitis B test. Hepatitis C test. HIV (human immunodeficiency virus) test. STI (sexually transmitted infection) testing, if you are at risk. Lung cancer screening. Colorectal cancer screening. Mammogram. Talk with your health care provider about when you should start having regular mammograms. This may depend on whether you have a family history of breast cancer. BRCA-related cancer screening. This may be done if you have a family  history of breast, ovarian, tubal, or peritoneal cancers. Bone density scan. This is done to screen for osteoporosis. Talk with your health care provider about your test results, treatment options, and if necessary, the need for more tests. Follow these instructions at home: Eating and drinking  Eat a diet that includes fresh fruits and vegetables, whole grains, lean protein, and low-fat dairy products. Take vitamin and mineral supplements as recommended by your health care provider. Do not drink alcohol if: Your health care provider tells you not to drink. You are pregnant, may be pregnant, or are planning to become pregnant. If you drink alcohol: Limit how much you have to 0-1 drink a day. Know how much alcohol is in your drink. In  the [place] one drink equals one 12 oz bottle of beer (355 mL), one 5 oz glass of wine (148 mL), or one 1 oz glass of hard liquor (44 mL). Lifestyle Brush your teeth every morning and night with fluoride toothpaste. Floss one time each day. Exercise for at least 30 minutes 5 or more days each week. Do not use any products that contain nicotine or tobacco. These products include cigarettes, chewing tobacco, and vaping devices, such as e-cigarettes. If you need help quitting, ask your health care provider. Do not use drugs. If you are sexually active, practice safe sex. Use a condom or other form of protection to prevent STIs. If you do not wish to become pregnant, use a form of birth control. If you plan to become pregnant, see your health care provider for a prepregnancy visit. Take aspirin only as told by your health care provider. Make sure that you understand how much to take and what form to take. Work with your health care provider to find out whether it is safe and beneficial for you to take aspirin daily. Find healthy ways to manage stress, such as: Meditation, yoga, or listening to music. Journaling. Talking to a trusted person. Spending time with friends and family. Minimize exposure to UV radiation to reduce your risk of skin cancer. Safety Always wear your seat belt while driving or riding in a vehicle. Do not drive: If you have been drinking alcohol. Do not ride with someone who has been drinking. When you are tired or distracted. While texting. If you have been using any mind-altering substances or drugs. Wear a helmet and other protective equipment during sports activities. If you have firearms in your house, make sure you follow all gun safety procedures. Seek help if you have been physically or sexually abused. What's next? Visit your health care provider once a year for an annual wellness visit. Ask your health care provider how often you should have your eyes and teeth  checked. Stay up to date on all vaccines. This information is not intended to replace advice given to you by your health care provider. Make sure you discuss any questions you have with your health care provider. Document Revised: 03/23/2021 Document Reviewed: 03/23/2021 Elsevier Patient Education  2024 Elsevier Inc.     Signed,   Meredith Staggers, MD [facility] 05/03/23 11:49 AM

## 2023-05-03 NOTE — Patient Instructions (Addendum)
Try decrease of toprol to 25mg  every day. Update by mychart in next few weeks. Stand up slowly if possible after prolonged seating. If lightheadedness continues we may need to adjust meds further.   I will check some other labs and let me know if there are concerns.  Let me know if any other medication refills are needed.  Can discuss the dental device with dental visit but could also follow back up with sleep specialist for other options if needed as well for OSA.  Let me know if I can help. Take care and thanks for coming today.  Preventive Care 19-86 Years Old, Female Preventive care refers to lifestyle choices and visits with your health care provider that can promote health and wellness. Preventive care visits are also called wellness exams. What can I expect for my preventive care visit? Counseling Your health care provider may ask you questions about your: Medical history, including: Past medical problems. Family medical history. Pregnancy history. Current health, including: Menstrual cycle. Method of birth control. Emotional well-being. Home life and relationship well-being. Sexual activity and sexual health. Lifestyle, including: Alcohol, nicotine or tobacco, and drug use. Access to firearms. Diet, exercise, and sleep habits. Work and work Astronomer. Sunscreen use. Safety issues such as seatbelt and bike helmet use. Physical exam Your health care provider will check your: Height and weight. These may be used to calculate your BMI (body mass index). BMI is a measurement that tells if you are at a healthy weight. Waist circumference. This measures the distance around your waistline. This measurement also tells if you are at a healthy weight and may help predict your risk of certain diseases, such as type 2 diabetes and high blood pressure. Heart rate and blood pressure. Body temperature. Skin for abnormal spots. What immunizations do I need?  Vaccines are usually given at  various ages, according to a schedule. Your health care provider will recommend vaccines for you based on your age, medical history, and lifestyle or other factors, such as travel or where you work. What tests do I need? Screening Your health care provider may recommend screening tests for certain conditions. This may include: Lipid and cholesterol levels. Diabetes screening. This is done by checking your blood sugar (glucose) after you have not eaten for a while (fasting). Pelvic exam and Pap test. Hepatitis B test. Hepatitis C test. HIV (human immunodeficiency virus) test. STI (sexually transmitted infection) testing, if you are at risk. Lung cancer screening. Colorectal cancer screening. Mammogram. Talk with your health care provider about when you should start having regular mammograms. This may depend on whether you have a family history of breast cancer. BRCA-related cancer screening. This may be done if you have a family history of breast, ovarian, tubal, or peritoneal cancers. Bone density scan. This is done to screen for osteoporosis. Talk with your health care provider about your test results, treatment options, and if necessary, the need for more tests. Follow these instructions at home: Eating and drinking  Eat a diet that includes fresh fruits and vegetables, whole grains, lean protein, and low-fat dairy products. Take vitamin and mineral supplements as recommended by your health care provider. Do not drink alcohol if: Your health care provider tells you not to drink. You are pregnant, may be pregnant, or are planning to become pregnant. If you drink alcohol: Limit how much you have to 0-1 drink a day. Know how much alcohol is in your drink. In the [place] one drink equals one 12 oz bottle  of beer (355 mL), one 5 oz glass of wine (148 mL), or one 1 oz glass of hard liquor (44 mL). Lifestyle Brush your teeth every morning and night with fluoride toothpaste. Floss one time each  day. Exercise for at least 30 minutes 5 or more days each week. Do not use any products that contain nicotine or tobacco. These products include cigarettes, chewing tobacco, and vaping devices, such as e-cigarettes. If you need help quitting, ask your health care provider. Do not use drugs. If you are sexually active, practice safe sex. Use a condom or other form of protection to prevent STIs. If you do not wish to become pregnant, use a form of birth control. If you plan to become pregnant, see your health care provider for a prepregnancy visit. Take aspirin only as told by your health care provider. Make sure that you understand how much to take and what form to take. Work with your health care provider to find out whether it is safe and beneficial for you to take aspirin daily. Find healthy ways to manage stress, such as: Meditation, yoga, or listening to music. Journaling. Talking to a trusted person. Spending time with friends and family. Minimize exposure to UV radiation to reduce your risk of skin cancer. Safety Always wear your seat belt while driving or riding in a vehicle. Do not drive: If you have been drinking alcohol. Do not ride with someone who has been drinking. When you are tired or distracted. While texting. If you have been using any mind-altering substances or drugs. Wear a helmet and other protective equipment during sports activities. If you have firearms in your house, make sure you follow all gun safety procedures. Seek help if you have been physically or sexually abused. What's next? Visit your health care provider once a year for an annual wellness visit. Ask your health care provider how often you should have your eyes and teeth checked. Stay up to date on all vaccines. This information is not intended to replace advice given to you by your health care provider. Make sure you discuss any questions you have with your health care provider. Document Revised:  03/23/2021 Document Reviewed: 03/23/2021 Elsevier Patient Education  2024 [employer].

## 2023-05-24 ENCOUNTER — Other Ambulatory Visit: Payer: Self-pay | Admitting: Family Medicine

## 2023-05-24 DIAGNOSIS — I10 Essential (primary) hypertension: Secondary | ICD-10-CM

## 2023-06-23 ENCOUNTER — Encounter: Payer: Self-pay | Admitting: Family Medicine

## 2023-11-07 ENCOUNTER — Encounter: Payer: Self-pay | Admitting: Family Medicine

## 2023-11-07 ENCOUNTER — Ambulatory Visit: Payer: BC Managed Care – PPO | Admitting: Family Medicine

## 2023-11-07 VITALS — BP 114/70 | HR 69 | Temp 97.9°F | Ht 63.5 in | Wt 243.0 lb

## 2023-11-07 DIAGNOSIS — I10 Essential (primary) hypertension: Secondary | ICD-10-CM

## 2023-11-07 DIAGNOSIS — Z8659 Personal history of other mental and behavioral disorders: Secondary | ICD-10-CM | POA: Diagnosis not present

## 2023-11-07 DIAGNOSIS — E785 Hyperlipidemia, unspecified: Secondary | ICD-10-CM

## 2023-11-07 DIAGNOSIS — Z808 Family history of malignant neoplasm of other organs or systems: Secondary | ICD-10-CM | POA: Diagnosis not present

## 2023-11-07 DIAGNOSIS — E66813 Obesity, class 3: Secondary | ICD-10-CM

## 2023-11-07 DIAGNOSIS — Z6841 Body Mass Index (BMI) 40.0 and over, adult: Secondary | ICD-10-CM

## 2023-11-07 LAB — COMPREHENSIVE METABOLIC PANEL
ALT: 32 U/L (ref 0–35)
AST: 29 U/L (ref 0–37)
Albumin: 4.6 g/dL (ref 3.5–5.2)
Alkaline Phosphatase: 75 U/L (ref 39–117)
BUN: 17 mg/dL (ref 6–23)
CO2: 32 meq/L (ref 19–32)
Calcium: 10.1 mg/dL (ref 8.4–10.5)
Chloride: 103 meq/L (ref 96–112)
Creatinine, Ser: 0.83 mg/dL (ref 0.40–1.20)
GFR: 80.47 mL/min (ref >60.00)
Glucose, Bld: 92 mg/dL (ref 70–99)
Potassium: 5 meq/L (ref 3.5–5.1)
Sodium: 140 meq/L (ref 135–145)
Total Bilirubin: 0.5 mg/dL (ref 0.2–1.2)
Total Protein: 7.3 g/dL (ref 6.0–8.3)

## 2023-11-07 LAB — LIPID PANEL
Cholesterol: 197 mg/dL (ref 0–200)
HDL: 63.7 mg/dL (ref >39.00)
LDL Cholesterol: 114 mg/dL — ABNORMAL HIGH (ref 0–99)
NonHDL: 133.02
Total CHOL/HDL Ratio: 3
Triglycerides: 97 mg/dL (ref 0.0–149.0)
VLDL: 19.4 mg/dL (ref 0.0–40.0)

## 2023-11-07 LAB — TSH: TSH: 2.23 u[IU]/mL (ref 0.35–5.50)

## 2023-11-07 MED ORDER — METOPROLOL SUCCINATE ER 25 MG PO TB24: ORAL_TABLET | ORAL | 1 refills | Status: DC

## 2023-11-07 NOTE — Progress Notes (Signed)
Subjective:  Patient ID: Hannah Buchanan, female    DOB: Mar 07, 1971  Age: 53 y.o. MRN: [id number]  CC:  Chief Complaint  Patient presents with   Medical Management of Chronic Issues    Pt is doing well, notes no concerns     HPI Hannah Buchanan presents for follow up - no med changes.   Hypertension: Chlorthalidone 25mg  few days per week.  toprol 37.5 every day.  No new side effects. Rare lightheadedness discussed in July 2024. Only if changing positions too fast.  OSA, unable to tolerate CPAP. Prior palpitations managed with toprol. No new symptoms.  Working on [employer] - walking, food choices, intake control.  Wearing compression stockings Home readings:120/70-80.  BP Readings from Last 3 Encounters:  11/07/23 114/70  05/03/23 120/62  03/02/23 126/84   Wt Readings from Last 3 Encounters:  11/07/23 243 lb (110.2 kg)  05/03/23 238 lb (108 kg)  05/01/22 233 lb 9.6 oz (106 kg)    Lab Results  Component Value Date   CREATININE 0.81 05/03/2023   Hyperlipidemia: Mild elevation, diet/exercise approach.   No FH of early heart disease.   Father passed at age 48 - metastatic lung cancer. She is doing ok.  The 10-year ASCVD risk score (Arnett DK, et al., 2019) is: 2%   Values used to calculate the score:     Age: 4 years     Sex: Female     Is Non-Hispanic African American: No     Diabetic: No     Tobacco smoker: No     Systolic Blood Pressure: 114 mmHg     Is BP treated: Yes     HDL Cholesterol: 55.5 mg/dL     Total Cholesterol: 223 mg/dL  Lab Results  Component Value Date   CHOL 223 (H) 05/03/2023   HDL 55.50 05/03/2023   LDLCALC 145 (H) 05/03/2023   TRIG 117.0 05/03/2023   CHOLHDL 4 05/03/2023   Lab Results  Component Value Date   ALT 29 05/03/2023   AST 24 05/03/2023   ALKPHOS 80 05/03/2023   BILITOT 0.6 05/03/2023    Depression: Priro zoloft - off since last spring and still stable, doing well.  Still feels ok off meds.  Grief symptoms with above losses,  but stable and no need for other resources at this time.      11/07/2023   10:16 AM 05/03/2023   10:48 AM 05/03/2023   10:45 AM 05/01/2022    2:12 PM 10/31/2021    9:09 AM  Depression screen PHQ 2/9  Decreased Interest 1 0 0 0 0  Down, Depressed, Hopeless 1 0  0 0  PHQ - 2 Score 2 0 0 0 0  Altered sleeping 1 2   1   Tired, decreased energy 2 1   2   Change in appetite 0 1   0  Feeling bad or failure about yourself  0 0   0  Trouble concentrating 0 1   0  Moving slowly or fidgety/restless 0 0   0  Suicidal thoughts 0 0   0  PHQ-9 Score 5 5   3   Difficult doing work/chores     Not difficult at all      11/07/2023   10:17 AM 05/03/2023   10:48 AM  GAD 7 : Generalized Anxiety Score  Nervous, Anxious, on Edge 1 0  Control/stop worrying 0 0  Worry too much - different things 0 0  Trouble relaxing 0 0  Restless 0 0  Easily annoyed or irritable 1 1  Afraid - awful might happen 0 0  Total GAD 7 Score 2 1    HM: Flu vaccine - declines along with shingles vaccine.  Mammogram last July at GYN, Pap test at that time as well.    History Patient Active Problem List   Diagnosis Date Noted   Allergic rhinitis due to pollen 11/18/2014   Essential hypertension, benign 11/18/2014   GERD 11/08/2009   OTHER DYSPHAGIA 11/08/2009   FLATULENCE ERUCTATION AND GAS PAIN 11/08/2009   Past Medical History:  Diagnosis Date   Allergy    Claritin daily yearround.   Arthritis    per pt   Depression    Esophageal stricture    GERD (gastroesophageal reflux disease)    Hiatal hernia    Hypertension    Pneumonia    Recurrent cold sores    Status post dilation of esophageal narrowing    Past Surgical History:  Procedure Laterality Date   Admission  10/09/1998   prolonged syncopal event; admission for 24 hours.  [facility]   BLADDER REPAIR  1998   [facility]/Gynecology.   BREAST SURGERY  1999   breast reduction   KNEE ARTHROSCOPY Right    10 yrs ago   UPPER GASTROINTESTINAL ENDOSCOPY  2018    Allergies  Allergen Reactions   Gluten Meal Other (See Comments)   Wheat    Prior to Admission medications   Medication Sig Start Date End Date Taking? Authorizing Provider  Cholecalciferol (VITAMIN D-3 PO) PRN   Yes [provider]  docusate sodium (COLACE) 100 MG capsule Take 100 mg by mouth as needed for mild constipation. PRN   Yes [provider]  loratadine (CLARITIN) 10 MG tablet Take 10 mg by mouth daily.   Yes [provider]  meloxicam (MOBIC) 15 MG tablet 7.5 mg as needed.  05/08/18  Yes [provider]  metoprolol succinate (TOPROL-XL) 25 MG 24 hr tablet TAKE 1 AND 1/2 TABLETS(37.5 MG) BY MOUTH EVERY DAY 05/24/23  Yes Shade Flood, MD  Misc Natural Products (NEURIVA PO)    Yes [provider]  Omega-3 Fatty Acids (FISH OIL PO) Fish Oil 1,200 mg (144 mg-216 mg) capsule   Yes [provider]  Probiotic Product (PROBIOTIC DAILY PO) Take 1 tablet by mouth daily.   Yes [provider]  valACYclovir (VALTREX) 500 MG tablet Take 500 mg by mouth as needed.   Yes [provider]  chlorthalidone (HYGROTON) 25 MG tablet Take 1 tablet (25 mg total) by mouth daily. Patient taking differently: Take 25 mg by mouth daily. PRN 05/03/23   Shade Flood, MD   Social History   Socioeconomic History   Marital status: Married    Spouse name: Not on file   Number of children: 2   Years of education: Not on file   Highest education level: Some college, no degree  Occupational History   Occupation: Engineer, site    Employer: [employer]  Tobacco Use   Smoking status: Never   Smokeless tobacco: Never  Vaping Use   Vaping status: Never Used  Substance and Sexual Activity   Alcohol use: Yes    Comment: occasional   Drug use: No   Sexual activity: Yes  Other Topics Concern   Not on file  Social History Narrative   Marital status: married x 26 years; happily      Chldren: 2 children (22, 25); no  grandchldren.      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   Social Drivers of Corporate investment banker Strain: Low Risk  (11/06/2023)   Overall Financial Resource Strain (CARDIA)    Difficulty of Paying Living Expenses: Not hard at all  Food Insecurity: No Food Insecurity (11/06/2023)   Hunger Vital Sign    Worried About Running Out of Food in the Last Year: Never true    Ran Out of Food in the Last Year: Never true  Transportation Needs: No Transportation Needs (11/06/2023)   PRAPARE - Administrator, Civil Service (Medical): No    Lack of Transportation (Non-Medical): No  Physical Activity: Insufficiently Active (11/06/2023)   Exercise Vital Sign    Days of Exercise per Week: 3 days    Minutes of Exercise per Session: 20 min  Stress: Stress Concern Present (11/06/2023)   [employer] of Occupational Health - Occupational Stress Questionnaire    Feeling of Stress : To some extent  Social Connections: Moderately Integrated (11/06/2023)   Social Connection and Isolation Panel [NHANES]    Frequency of Communication with Friends and Family: More than three times a week    Frequency of Social Gatherings with Friends and Family: Once a week    Attends Religious Services: 1 to 4 times per year    Active Member of [employer] or Organizations: No    Attends Engineer, structural: Not on file    Marital Status: Married  Catering manager Violence: Not on file    Review of Systems  Constitutional:  Negative for fatigue and unexpected weight change.  Respiratory:  Negative for chest tightness and shortness of breath.   Cardiovascular:  Negative for chest pain, palpitations and leg swelling.  Gastrointestinal:  Negative for abdominal pain and blood in stool.  Neurological:   Negative for dizziness, syncope, light-headedness and headaches.     Objective:   Vitals:   11/07/23 1009  BP: 114/70  Pulse: 69  Temp: 97.9 F (36.6 C)  TempSrc: Temporal  SpO2: 97%  Weight: 243 lb (110.2 kg)  Height: 5' 3.5" (1.613 m)     Physical Exam Vitals reviewed.  Constitutional:      Appearance: Normal appearance. She is well-developed.  HENT:     Head: Normocephalic and atraumatic.  Eyes:     Conjunctiva/sclera: Conjunctivae normal.     Pupils: Pupils are equal, round, and reactive to light.  Neck:     Vascular: No carotid bruit.  Cardiovascular:     Rate and Rhythm: Normal rate and regular rhythm.     Heart sounds: Normal heart sounds.  Pulmonary:     Effort: Pulmonary effort is normal.     Breath sounds: Normal breath sounds.  Abdominal:     Palpations: Abdomen is soft. There is no pulsatile mass.     Tenderness: There is no abdominal tenderness.  Musculoskeletal:     Right lower leg: No edema.     Left lower leg: No edema.  Skin:    General: Skin is warm and dry.  Neurological:     Mental Status: She is alert and oriented to person, place, and time.  Psychiatric:        Mood and Affect: Mood normal.        Behavior: Behavior normal.        Assessment & Plan:  Hannah Buchanan is a 53 y.o. female . Essential hypertension - Plan: Comprehensive metabolic panel, TSH, metoprolol succinate (TOPROL-XL) 25 MG 24 hr tablet  -Stable with current regimen.  If any increased lightheadedness, orthostatic symptoms could consider lower dose of Toprol.  Intermittent dosing of chlorthalidone at this time.  Check labs and adjust plan accordingly with RTC precautions.  Hyperlipidemia, unspecified hyperlipidemia type - Plan: Comprehensive metabolic panel, Lipid panel  -No family history of early heart disease known.  Low ASCVD risk score.  Continue diet/exercise approach, check labs and adjust plan accordingly.  Family history of thyroid cancer - Plan:  TSH  -Continue to monitor TSH.  History of depression  -Stable off meds.  Appropriate grieving need for resources.  RTC precautions.  Class 3 severe obesity with serious comorbidity and body mass index (BMI) of 40.0 to 44.9 in adult, unspecified obesity type (HCC)  -Continue with healthy weight and wellness, check TSH as above.  Can also check into coverage of weight loss medications and discuss further if those are covered.  Meds ordered this encounter  Medications   metoprolol succinate (TOPROL-XL) 25 MG 24 hr tablet    Sig: TAKE 1 AND 1/2 TABLETS(37.5 MG) BY MOUTH EVERY DAY    Dispense:  135 tablet    Refill:  1   Patient Instructions  Thanks for coming in today.  If any concerns on labs I will let you know.  Number below for weight specialist. If you would like to try a medication for weight loss, check with insurance to see if Wegovy or Zepbound are covered and if so, schedule visit to discuss further.  Sorry to hear about your losses. Please let me know if we can help but sounds like you are grieving appropriately at this time. Hang in there.   [facility]  [phone]     Signed,   Meredith Staggers, MD [facility], [facility] 11/07/23 11:22 AM

## 2023-11-07 NOTE — Patient Instructions (Signed)
Thanks for coming in today.  If any concerns on labs I will let you know.  Number below for weight specialist. If you would like to try a medication for weight loss, check with insurance to see if Wegovy or Zepbound are covered and if so, schedule visit to discuss further.  Sorry to hear about your losses. Please let me know if we can help but sounds like you are grieving appropriately at this time. Hang in there.   [facility]  [phone]

## 2023-11-11 ENCOUNTER — Encounter: Payer: Self-pay | Admitting: Family Medicine

## 2023-11-29 ENCOUNTER — Encounter: Payer: Self-pay | Admitting: Family Medicine

## 2024-04-28 ENCOUNTER — Encounter: Payer: Self-pay | Admitting: Family Medicine

## 2024-04-28 NOTE — Telephone Encounter (Signed)
 Please see message below from patient in regards to starting weight loss medication.

## 2024-05-15 ENCOUNTER — Ambulatory Visit (INDEPENDENT_AMBULATORY_CARE_PROVIDER_SITE_OTHER): Payer: BC Managed Care – PPO | Admitting: Family Medicine

## 2024-05-15 ENCOUNTER — Encounter: Payer: Self-pay | Admitting: Family Medicine

## 2024-05-15 VITALS — BP 112/68 | HR 70 | Temp 98.1°F | Resp 11 | Ht 63.5 in | Wt 242.8 lb

## 2024-05-15 DIAGNOSIS — Z13220 Encounter for screening for lipoid disorders: Secondary | ICD-10-CM

## 2024-05-15 DIAGNOSIS — Z131 Encounter for screening for diabetes mellitus: Secondary | ICD-10-CM

## 2024-05-15 DIAGNOSIS — Z130 Encounter for screening for diseases of the blood and blood-forming organs and certain disorders involving the immune mechanism: Secondary | ICD-10-CM | POA: Diagnosis not present

## 2024-05-15 DIAGNOSIS — Z0000 Encounter for general adult medical examination without abnormal findings: Secondary | ICD-10-CM

## 2024-05-15 DIAGNOSIS — I10 Essential (primary) hypertension: Secondary | ICD-10-CM

## 2024-05-15 DIAGNOSIS — E66813 Obesity, class 3: Secondary | ICD-10-CM

## 2024-05-15 DIAGNOSIS — Z1329 Encounter for screening for other suspected endocrine disorder: Secondary | ICD-10-CM

## 2024-05-15 DIAGNOSIS — G4733 Obstructive sleep apnea (adult) (pediatric): Secondary | ICD-10-CM | POA: Diagnosis not present

## 2024-05-15 DIAGNOSIS — Z6841 Body Mass Index (BMI) 40.0 and over, adult: Secondary | ICD-10-CM

## 2024-05-15 LAB — CBC
HCT: 40.7 % (ref 36.0–46.0)
Hemoglobin: 13.7 g/dL (ref 12.0–15.0)
MCHC: 33.5 g/dL (ref 30.0–36.0)
MCV: 88.8 fl (ref 78.0–100.0)
Platelets: 180 K/uL (ref 150.0–400.0)
RBC: 4.58 Mil/uL (ref 3.87–5.11)
RDW: 13.9 % (ref 11.5–15.5)
WBC: 6.5 K/uL (ref 4.0–10.5)

## 2024-05-15 LAB — HEMOGLOBIN A1C: Hgb A1c MFr Bld: 5.7 % (ref 4.6–6.5)

## 2024-05-15 LAB — TSH: TSH: 1.8 u[IU]/mL (ref 0.35–5.50)

## 2024-05-15 MED ORDER — METOPROLOL SUCCINATE ER 25 MG PO TB24: ORAL_TABLET | ORAL | 1 refills | Status: DC

## 2024-05-15 MED ORDER — TIRZEPATIDE-WEIGHT MANAGEMENT 2.5 MG/0.5ML ~~LOC~~ SOLN: 2.5000 mg | SUBCUTANEOUS | 1 refills | Status: DC

## 2024-05-15 NOTE — Patient Instructions (Signed)
 Thank you for coming in today.  I will check labs and let you know if there are any concerns. As long as your aunt did not have medullary thyroid  cancer, okay to start the zepbound  injection once per week.  See information below.  Let me know if you have any side effects but we will start low-dose initially to make sure you tolerate it.  I did order that with the indication of weight loss as well as sleep apnea as it has a indication and may have better coverage through insurance.  If it is not covered through insurance, let me know and I can order the vials to pay for out-of-pocket..  Either way can follow-up in 1 month once you have started medication.  Please let me know if there are questions and take care!  Tirzepatide  Injection (Weight Management) What is this medication? TIRZEPATIDE  (tir ZEP a tide) promotes weight loss. It may also be used to maintain weight loss.  It works by decreasing appetite. It can be used to treat sleep apnea. Changes to diet and exercise are often combined with this medication. This medicine may be used for other purposes; ask your health care provider or pharmacist if you have questions. COMMON BRAND NAME(S): Zepbound  What should I tell my care team before I take this medication? They need to know if you have any of these conditions: Diabetes Eye disease caused by diabetes Gallbladder disease Have or have had depression Have or have had pancreatitis Having surgery Kidney disease Personal or family history of MEN 2, a condition that causes endocrine gland tumors Personal or family history of thyroid  cancer Stomach or intestine problems, such as problems digesting food Suicidal thoughts, plans, or attempt An unusual or allergic reaction to tirzepatide , other medications, foods, dyes, or preservatives Pregnant or trying to get pregnant Breastfeeding How should I use this medication? This medication is injected under the skin. You will be taught how to prepare  and give it. Take it as directed on the prescription label. Keep taking it unless your care team tells you to stop. It is important that you put your used needles and syringes in a special sharps container. Do not put them in a trash can. If you do not have a sharps container, call your pharmacist or care team to get one. A special MedGuide will be given to you by the pharmacist with each prescription and refill. Be sure to read this information carefully each time. This medication comes with INSTRUCTIONS FOR USE. Ask your pharmacist for directions on how to use this medication. Read the information carefully. Talk to your pharmacist or care team if you have questions. Talk to your care team about the use of this medication in children. Special care may be needed. Overdosage: If you think you have taken too much of this medicine contact a poison control center or emergency room at once. NOTE: This medicine is only for you. Do not share this medicine with others. What if I miss a dose? If you miss a dose, take it as soon as you can unless it is more than 4 days (96 hours) late. If it is more than 4 days late, skip the missed dose. Take the next dose at the normal time. Do not take 2 doses within 3 days (72 hours) of each other. What may interact with this medication? Certain medications for diabetes, such as insulin, glyburide, glipizide This medication may affect how other medications work. Talk with your care team about  all of the medications you take. They may suggest changes to your treatment plan to lower the risk of side effects and to make sure your medications work as intended. This list may not describe all possible interactions. Give your health care provider a list of all the medicines, herbs, non-prescription drugs, or dietary supplements you use. Also tell them if you smoke, drink alcohol, or use illegal drugs. Some items may interact with your medicine. What should I watch for while using  this medication? Visit your care team for regular checks on your progress. Tell your care team if your condition does not start to get better or if it gets worse. Tell your care team if you are taking medication to treat diabetes, such as insulin or glipizide. This may increase your risk of low blood sugar. Know the symptoms of low blood sugar and how to treat it. Talk to your care team about your risk of cancer. You may be more at risk for certain types of cancer if you take this medication. Talk to your care team right away if you have a lump or swelling in your neck, hoarseness that does not go away, trouble swallowing, shortness of breath, or trouble breathing. Make sure you stay hydrated while taking this medication. Drink water often. Eat fruits and veggies that have a high water content. Drink more water when it is hot or you are active. Talk to your care team right away if you have fever, infection, vomiting, diarrhea, or if you sweat a lot while taking this medication. The loss of too much body fluid may make it dangerous for you to take this medication. If you are going to need surgery or a procedure, tell your care team that you are taking this medication. Estrogen and progestin hormones that you take by mouth may not work as well while you are taking this medication. Switch to a non-oral contraceptive or add a barrier contraceptive for 4 weeks after starting this medication and after each dose increase. Talk to your care team about contraceptive options. They can help you find the option that works for you. Do not take this medication without first talking to your care team if you may be or could become pregnant. Your care team can help you find the option that works for you. Weight loss is not recommended during pregnancy. Talk to your care team if you are breastfeeding. When recommended, this medication may be taken. Its use during breastfeeding has not been well studied. Your care team may  suggest other options. What side effects may I notice from receiving this medication? Side effects that you should report to your care team as soon as possible: Allergic reactions--skin rash, itching, hives, swelling of the face, lips, tongue, or throat Change in vision Dehydration--increased thirst, dry mouth, feeling faint or lightheaded, headache, dark yellow or brown urine Fast or irregular heartbeat Gallbladder problems--severe stomach pain, nausea, vomiting, fever Kidney injury--decrease in the amount of urine, swelling of the ankles, hands, or feet Pancreatitis--severe stomach pain that spreads to your back or gets worse after eating or when touched, fever, nausea, vomiting Thoughts of suicide or self-harm, worsening mood, feelings of depression Thyroid  cancer--new mass or lump in the neck, pain or trouble swallowing, trouble breathing, hoarseness Side effects that usually do not require medical attention (report these to your care team if they continue or are bothersome): Constipation Diarrhea Loss of appetite Nausea Upset stomach This list may not describe all possible side effects. Call your  doctor for medical advice about side effects. You may report side effects to FDA at 1-800-FDA-1088. Where should I keep my medication? Keep out of the reach of children and pets. Store in a refrigerator or at room temperature up to 30 degrees C (86 degrees F). Keep it in the original container. Protect from light. Refrigeration (preferred): Store in the refrigerator. Do not freeze. Get rid of any unused medication after the expiration date. Room temperature: This medication may be stored at room temperature for up to 21 days. If it is stored at room temperature, get rid of any unused medication after 21 days or after it expires, whichever is first. To get rid of medications that are no longer needed or have expired: Take the medication to a medication take-back program. Check with your pharmacy  or law enforcement to find a location. If you cannot return the medication, ask your pharmacist or care team how to get rid of this medication safely. NOTE: This sheet is a summary. It may not cover all possible information. If you have questions about this medicine, talk to your doctor, pharmacist, or health care provider.  2025 Elsevier/Gold Standard (2023-10-23 00:00:00)  Preventive Care 63-26 Years Old, Female Preventive care refers to lifestyle choices and visits with your health care provider that can promote health and wellness. Preventive care visits are also called wellness exams. What can I expect for my preventive care visit? Counseling Your health care provider may ask you questions about your: Medical history, including: Past medical problems. Family medical history. Pregnancy history. Current health, including: Menstrual cycle. Method of birth control. Emotional well-being. Home life and relationship well-being. Sexual activity and sexual health. Lifestyle, including: Alcohol, nicotine or tobacco, and drug use. Access to firearms. Diet, exercise, and sleep habits. Work and work Astronomer. Sunscreen use. Safety issues such as seatbelt and bike helmet use. Physical exam Your health care provider will check your: Height and weight. These may be used to calculate your BMI (body mass index). BMI is a measurement that tells if you are at a healthy weight. Waist circumference. This measures the distance around your waistline. This measurement also tells if you are at a healthy weight and may help predict your risk of certain diseases, such as type 2 diabetes and high blood pressure. Heart rate and blood pressure. Body temperature. Skin for abnormal spots. What immunizations do I need?  Vaccines are usually given at various ages, according to a schedule. Your health care provider will recommend vaccines for you based on your age, medical history, and lifestyle or other  factors, such as travel or where you work. What tests do I need? Screening Your health care provider may recommend screening tests for certain conditions. This may include: Lipid and cholesterol levels. Diabetes screening. This is done by checking your blood sugar (glucose) after you have not eaten for a while (fasting). Pelvic exam and Pap test. Hepatitis B test. Hepatitis C test. HIV (human immunodeficiency virus) test. STI (sexually transmitted infection) testing, if you are at risk. Lung cancer screening. Colorectal cancer screening. Mammogram. Talk with your health care provider about when you should start having regular mammograms. This may depend on whether you have a family history of breast cancer. BRCA-related cancer screening. This may be done if you have a family history of breast, ovarian, tubal, or peritoneal cancers. Bone density scan. This is done to screen for osteoporosis. Talk with your health care provider about your test results, treatment options, and if necessary, the need  for more tests. Follow these instructions at home: Eating and drinking  Eat a diet that includes fresh fruits and vegetables, whole grains, lean protein, and low-fat dairy products. Take vitamin and mineral supplements as recommended by your health care provider. Do not drink alcohol if: Your health care provider tells you not to drink. You are pregnant, may be pregnant, or are planning to become pregnant. If you drink alcohol: Limit how much you have to 0-1 drink a day. Know how much alcohol is in your drink. In the [place] one drink equals one 12 oz bottle of beer (355 mL), one 5 oz glass of wine (148 mL), or one 1 oz glass of hard liquor (44 mL). Lifestyle Brush your teeth every morning and night with fluoride toothpaste. Floss one time each day. Exercise for at least 30 minutes 5 or more days each week. Do not use any products that contain nicotine or tobacco. These products include  cigarettes, chewing tobacco, and vaping devices, such as e-cigarettes. If you need help quitting, ask your health care provider. Do not use drugs. If you are sexually active, practice safe sex. Use a condom or other form of protection to prevent STIs. If you do not wish to become pregnant, use a form of birth control. If you plan to become pregnant, see your health care provider for a prepregnancy visit. Take aspirin only as told by your health care provider. Make sure that you understand how much to take and what form to take. Work with your health care provider to find out whether it is safe and beneficial for you to take aspirin daily. Find healthy ways to manage stress, such as: Meditation, yoga, or listening to music. Journaling. Talking to a trusted person. Spending time with friends and family. Minimize exposure to UV radiation to reduce your risk of skin cancer. Safety Always wear your seat belt while driving or riding in a vehicle. Do not drive: If you have been drinking alcohol. Do not ride with someone who has been drinking. When you are tired or distracted. While texting. If you have been using any mind-altering substances or drugs. Wear a helmet and other protective equipment during sports activities. If you have firearms in your house, make sure you follow all gun safety procedures. Seek help if you have been physically or sexually abused. What's next? Visit your health care provider once a year for an annual wellness visit. Ask your health care provider how often you should have your eyes and teeth checked. Stay up to date on all vaccines. This information is not intended to replace advice given to you by your health care provider. Make sure you discuss any questions you have with your health care provider. Document Revised: 03/23/2021 Document Reviewed: 03/23/2021 Elsevier Patient Education  2024 [employer].

## 2024-05-15 NOTE — Progress Notes (Signed)
 Subjective:  Patient ID: Hannah Buchanan, female    DOB: 08-08-1971  Age: 53 y.o. MRN: [id number]  CC:  Chief Complaint  Patient presents with   Annual Exam    Patient wants to talk about weight loss medication. There is a protal message about it    HPI Hannah Buchanan presents for Annual Exam, no health changes.   PCP, [facility], Wanda Qua, PA at [facility], osteoarthritis and pain of right knee joint.  Gynecology, Dr. Curlene, pap and mammogram in July - at [facility].   Hypertension: Treated with chlorthalidone  12.5 mg daily as needed with ankle swelling - taking once per month, Toprol -XL 37.5 mg daily.  Denies any med side effects.  Also with history of obstructive sleep apnea but unable to tolerate CPAP.  Prior AHI 23.  History of palpitations managed with Toprol . Doing ok.  Sleeping ok. Some menopausal symptoms. No daytime naps.  Home readings: normal - 115/70 range.  BP Readings from Last 3 Encounters:  05/15/24 112/68  11/07/23 114/70  05/03/23 120/62   Lab Results  Component Value Date   CREATININE 0.83 11/07/2023   Allergic rhinitis Treated with Claritin. Working well.   History of cold sores/fever blisters Valtrex as needed.  Less frequent with magnesium supplementation.  Tried daily Valtrex in the past but caused headaches.  Still doing well - last flare awhile ago - in Spring.   Obesity Would like to discuss injectable medication for weight loss.  Briefly discussed previously.  Some difficulty with weight loss on her own. Has tried weight loss with Optavia for year or two - some weight loss, then regained. Tried increased exercise - minimal benefit and limited by knee. Portion control - minimal improvement in weight  No weight loss meds.  No hx of pancreatitis.  No FH of MEN syndrome. Maternal aunt with thyroid  cancer - unknown type.  Possible risks of meds and side effects discussed.  No coverage for meds, aware of costs.   Body mass index is  42.34 kg/m. Wt Readings from Last 3 Encounters:  05/15/24 242 lb 12.8 oz (110.1 kg)  11/07/23 243 lb (110.2 kg)  05/03/23 238 lb (108 kg)   Depression Sertraline 100 mg in the past, but had weaned off medication last year and was doing well without recurrence of symptoms. Still doing well.      05/15/2024    9:40 AM 11/07/2023   10:16 AM 05/03/2023   10:48 AM 05/03/2023   10:45 AM 05/01/2022    2:12 PM  Depression screen PHQ 2/9  Decreased Interest 0 1 0 0 0  Down, Depressed, Hopeless 0 1 0  0  PHQ - 2 Score 0 2 0 0 0  Altered sleeping 1 1 2     Tired, decreased energy 2 2 1     Change in appetite 2 0 1    Feeling bad or failure about yourself  0 0 0    Trouble concentrating 1 0 1    Moving slowly or fidgety/restless 0 0 0    Suicidal thoughts 0 0 0    PHQ-9 Score 6 5 5     Difficult doing work/chores Somewhat difficult        Health Maintenance  Topic Date Due   Hepatitis B Vaccines (1 of 3 - 19+ 3-dose series) Never done   Pneumococcal Vaccine: 50+ Years (1 of 1 - PCV) Never done   Zoster Vaccines- Shingrix (1 of 2) Never done   INFLUENZA  VACCINE  05/09/2024   MAMMOGRAM  04/25/2025   Cervical Cancer Screening (HPV/Pap Cotest)  11/09/2025   Colonoscopy  12/02/2030   DTaP/Tdap/Td (3 - Td or Tdap) 05/02/2033   Hepatitis C Screening  Completed   HIV Screening  Completed   HPV VACCINES  Aged Out   Meningococcal B Vaccine  Aged Out   COVID-19 Vaccine  Discontinued  Colonoscopy 2022. Dr. Aneita, prior esophageal stricture with dilation. Followed by GI.  Recent pap and mammogram as above.   Immunization History  Administered Date(s) Administered   Influenza-Unspecified 07/25/2018   Tdap 12/13/2009, 05/03/2023  Flu vaccine declined.  Pneumonia vaccine declined.  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  Shingrix - declined.  Covid booster declined.   No results found. Optho - wears glasses. Appt 2 years ago - due, will schedule  Dental: every 6 months - recent  visit.   Alcohol: 1 per week or less.   Tobacco: none  Exercise: limited by knee, but walking, bike and leg machine at PT dept at work. 80 min per week.    History Patient Active Problem List   Diagnosis Date Noted   Contact dermatitis due to poison ivy 03/31/2022   Pain in left foot 01/23/2022   Arthralgia of right knee 02/25/2020   Osteoarthritis of right knee 02/25/2020   Low back pain 01/23/2020   Benign esophageal stricture 10/28/2019   High compliance bladder 10/28/2019   Hypertensive disorder 10/28/2019   Unilateral primary osteoarthritis of first carpometacarpal joint, right hand 01/06/2019   Pain of right thumb 01/03/2019   Osteoarthritis of left knee 09/28/2018   Acute urticaria 01/22/2018   Allergic rhinitis due to pollen 11/18/2014   Essential hypertension, benign 11/18/2014   GERD 11/08/2009   OTHER DYSPHAGIA 11/08/2009   FLATULENCE ERUCTATION AND GAS PAIN 11/08/2009   Past Medical History:  Diagnosis Date   Allergy    Claritin daily yearround.   Arthritis    per pt   Depression    Esophageal stricture    GERD (gastroesophageal reflux disease)    Hiatal hernia    Hypertension    Pneumonia    Recurrent cold sores    Status post dilation of esophageal narrowing    Past Surgical History:  Procedure Laterality Date   Admission  10/09/1998   prolonged syncopal event; admission for 24 hours.  [facility]   BLADDER REPAIR  1998   [facility]/Gynecology.   BREAST SURGERY  1999   breast reduction   KNEE ARTHROSCOPY Right    10 yrs ago   UPPER GASTROINTESTINAL ENDOSCOPY  2018   Allergies  Allergen Reactions   Gluten Meal Other (See Comments)   Wheat    Prior to Admission medications   Medication Sig Start Date End Date Taking? Authorizing Provider  chlorthalidone  (HYGROTON ) 25 MG tablet Take 1 tablet (25 mg total) by mouth daily. 05/03/23  Yes Levora Reyes SAUNDERS, MD  Cholecalciferol (VITAMIN D -3 PO) PRN   Yes [provider]  docusate sodium  (COLACE) 100 MG capsule Take 100 mg by mouth as needed for mild constipation. PRN   Yes [provider]  Hyaluronan (ORTHOVISC) 30 MG/2ML SOSY intra-articular injection INJECT THE CONTENTS OF 1 SYRINGE INTRA-ARTICULARLY IN RIGHT KNEE ONCE A WEEK FOR 3 WEEKS 05/06/24  Yes [provider]  loratadine (CLARITIN) 10 MG tablet Take 10 mg by mouth daily.   Yes [provider]  meloxicam (MOBIC) 15 MG tablet 7.5 mg as needed.  05/08/18  Yes [provider]  metoprolol  succinate (TOPROL -XL) 25 MG 24 hr tablet TAKE 1 AND 1/2 TABLETS(37.5 MG) BY MOUTH EVERY DAY 11/07/23  Yes Levora Reyes SAUNDERS, MD  Misc Natural Products (NEURIVA PO)    Yes [provider]  Omega-3 Fatty Acids (FISH OIL PO) Fish Oil 1,200 mg (144 mg-216 mg) capsule   Yes [provider]  Probiotic Product (PROBIOTIC DAILY PO) Take 1 tablet by mouth daily.   Yes [provider]  valACYclovir (VALTREX) 500 MG tablet Take 500 mg by mouth as needed.   Yes [provider]   Social History   Socioeconomic History   Marital status: Married    Spouse name: Not on file   Number of children: 2   Years of education: Not on file   Highest education level: Some college, no degree  Occupational History   Occupation: Manufacturing engineer: [employer]  Tobacco Use   Smoking status: Never   Smokeless tobacco: Never  Vaping Use   Vaping status: Never Used  Substance and Sexual Activity   Alcohol use: Yes    Comment: occasional   Drug use: No   Sexual activity: Yes  Other Topics Concern   Not on file  Social History Narrative   Marital status: married x 26 years; happily      Chldren: 2 children (22, 25); no grandchldren.      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   Social Drivers of Corporate investment banker Strain: Low Risk  (05/14/2024)   Overall Financial Resource Strain (CARDIA)    Difficulty of Paying Living Expenses: Not hard at all  Food Insecurity: No Food Insecurity (05/14/2024)   Hunger Vital Sign    Worried About Running Out of Food in the Last Year: Never true    Ran Out of Food in the Last Year: Never true  Transportation Needs: No Transportation Needs (05/14/2024)   PRAPARE - Administrator, Civil Service (Medical): No    Lack of Transportation (Non-Medical): No  Physical Activity: Insufficiently Active (05/14/2024)   Exercise Vital Sign    Days of Exercise per Week: 3 days    Minutes of Exercise per Session: 40 min  Stress: No Stress Concern Present (05/14/2024)   [employer] of Occupational Health - Occupational Stress Questionnaire    Feeling of Stress: Only a little  Social Connections: Moderately Integrated (05/14/2024)   Social Connection and Isolation Panel    Frequency of Communication with Friends and Family: More than three times a week    Frequency of Social Gatherings with Friends and Family: Once a week    Attends Religious Services: 1 to 4 times per year    Active Member of [employer] or Organizations: No    Attends Engineer, structural: Not on file    Marital Status: Married  Catering manager Violence: Not on file    Review of Systems 13 point review of systems per patient health survey noted.  Negative other than as indicated above or in HPI.    Objective:   Vitals:   05/15/24 0936  BP: 112/68  Pulse: 70  Resp: 11  Temp: 98.1 F (36.7 C)  TempSrc: Temporal  SpO2: 98%  Weight: 242 lb 12.8 oz (110.1 kg)  Height: 5' 3.5 (1.613 m)     Physical Exam Constitutional:      Appearance: She is well-developed. She is obese. She is not ill-appearing or diaphoretic.  HENT:     Head: Normocephalic and atraumatic.     Right Ear: External ear normal.     Left Ear: External ear  normal.  Eyes:     Conjunctiva/sclera: Conjunctivae normal.     Pupils: Pupils are equal, round, and reactive to light.  Neck:     Thyroid : No thyromegaly.  Cardiovascular:     Rate and Rhythm: Normal rate and regular rhythm.     Heart sounds: Normal heart sounds. No murmur heard. Pulmonary:     Effort: Pulmonary effort is normal. No respiratory distress.     Breath sounds: Normal breath sounds. No wheezing.  Abdominal:     General: Bowel sounds are normal.     Palpations: Abdomen is soft.     Tenderness: There is no abdominal tenderness.  Musculoskeletal:        General: No tenderness. Normal range of motion.     Cervical back: Normal range of motion and neck supple.  Lymphadenopathy:     Cervical: No cervical adenopathy.  Skin:    General: Skin is warm and dry.     Findings: No rash.  Neurological:     Mental Status: She is alert and oriented to person, place, and time.  Psychiatric:        Behavior: Behavior normal.        Thought Content: Thought content normal.        Assessment & Plan:  Hannah Buchanan is a 53 y.o. female . Annual physical exam - Plan: tirzepatide  (ZEPBOUND ) 2.5 MG/0.5ML injection vial  --anticipatory guidance as below in AVS, screening labs above. Health maintenance items as above in HPI discussed/recommended as applicable.   OSA (obstructive sleep apnea) - Plan: tirzepatide  (ZEPBOUND ) 2.5 MG/0.5ML injection vial Class 3 severe obesity with serious comorbidity and body mass index (BMI) of 40.0 to 44.9 in adult, unspecified obesity type  Obesity as above without success on multiple attempts with weight loss program Optavia, dietary changes, exercise although that is somewhat limited with her knee issues.  She does have underlying sleep apnea and unable to tolerate CPAP.  We did discuss potential side effects risks, and likely long-term need for GLP-1 as well as other medication options.  She would like to try GLP-1, GIP for weight loss.  She will check  with her family history to make sure there was no medullary thyroid  carcinoma.  Will start on zepbound  given indication for sleep apnea treatment as well as weight loss.  If not covered by insurance can switch to the vials.  1 month follow-up.  Anticipate increased doses as tolerated.  If difficulty with weight loss of treatment above consider healthy weight and wellness but she would like to try treatment through here first.  Screening, anemia, deficiency, iron - Plan: CBC  Screening for thyroid  disorder - Plan: TSH  Screening for hyperlipidemia - Plan: Comprehensive metabolic panel with GFR, Lipid panel  Screening for diabetes mellitus - Plan: Comprehensive metabolic panel with GFR, Hemoglobin A1c  Essential hypertension - Plan: metoprolol  succinate (TOPROL -XL) 25 MG 24 hr tablet  - Stable on current regimen, no changes.  Chlorthalidone  low-dose as needed for edema.  Anticipate this improvement  as well with weight loss.  Meds ordered this encounter  Medications   tirzepatide  (ZEPBOUND ) 2.5 MG/0.5ML injection vial    Sig: Inject 2.5 mg into the skin once a week.    Dispense:  2 mL    Refill:  1   metoprolol  succinate (TOPROL -XL) 25 MG 24 hr tablet    Sig: TAKE 1 AND 1/2 TABLETS(37.5 MG) BY MOUTH EVERY DAY    Dispense:  135 tablet    Refill:  1   Patient Instructions  Thank you for coming in today.  I will check labs and let you know if there are any concerns. As long as your aunt did not have medullary thyroid  cancer, okay to start the zepbound  injection once per week.  See information below.  Let me know if you have any side effects but we will start low-dose initially to make sure you tolerate it.  I did order that with the indication of weight loss as well as sleep apnea as it has a indication and may have better coverage through insurance.  If it is not covered through insurance, let me know and I can order the vials to pay for out-of-pocket..  Either way can follow-up in 1 month once  you have started medication.  Please let me know if there are questions and take care!  Tirzepatide  Injection (Weight Management) What is this medication? TIRZEPATIDE  (tir ZEP a tide) promotes weight loss. It may also be used to maintain weight loss.  It works by decreasing appetite. It can be used to treat sleep apnea. Changes to diet and exercise are often combined with this medication. This medicine may be used for other purposes; ask your health care provider or pharmacist if you have questions. COMMON BRAND NAME(S): Zepbound  What should I tell my care team before I take this medication? They need to know if you have any of these conditions: Diabetes Eye disease caused by diabetes Gallbladder disease Have or have had depression Have or have had pancreatitis Having surgery Kidney disease Personal or family history of MEN 2, a condition that causes endocrine gland tumors Personal or family history of thyroid  cancer Stomach or intestine problems, such as problems digesting food Suicidal thoughts, plans, or attempt An unusual or allergic reaction to tirzepatide , other medications, foods, dyes, or preservatives Pregnant or trying to get pregnant Breastfeeding How should I use this medication? This medication is injected under the skin. You will be taught how to prepare and give it. Take it as directed on the prescription label. Keep taking it unless your care team tells you to stop. It is important that you put your used needles and syringes in a special sharps container. Do not put them in a trash can. If you do not have a sharps container, call your pharmacist or care team to get one. A special MedGuide will be given to you by the pharmacist with each prescription and refill. Be sure to read this information carefully each time. This medication comes with INSTRUCTIONS FOR USE. Ask your pharmacist for directions on how to use this medication. Read the information carefully. Talk to your  pharmacist or care team if you have questions. Talk to your care team about the use of this medication in children. Special care may be needed. Overdosage: If you think you have taken too much of this medicine contact a poison control center or emergency room at once. NOTE: This medicine is only for you. Do not share this medicine with others. What  if I miss a dose? If you miss a dose, take it as soon as you can unless it is more than 4 days (96 hours) late. If it is more than 4 days late, skip the missed dose. Take the next dose at the normal time. Do not take 2 doses within 3 days (72 hours) of each other. What may interact with this medication? Certain medications for diabetes, such as insulin, glyburide, glipizide This medication may affect how other medications work. Talk with your care team about all of the medications you take. They may suggest changes to your treatment plan to lower the risk of side effects and to make sure your medications work as intended. This list may not describe all possible interactions. Give your health care provider a list of all the medicines, herbs, non-prescription drugs, or dietary supplements you use. Also tell them if you smoke, drink alcohol, or use illegal drugs. Some items may interact with your medicine. What should I watch for while using this medication? Visit your care team for regular checks on your progress. Tell your care team if your condition does not start to get better or if it gets worse. Tell your care team if you are taking medication to treat diabetes, such as insulin or glipizide. This may increase your risk of low blood sugar. Know the symptoms of low blood sugar and how to treat it. Talk to your care team about your risk of cancer. You may be more at risk for certain types of cancer if you take this medication. Talk to your care team right away if you have a lump or swelling in your neck, hoarseness that does not go away, trouble swallowing,  shortness of breath, or trouble breathing. Make sure you stay hydrated while taking this medication. Drink water often. Eat fruits and veggies that have a high water content. Drink more water when it is hot or you are active. Talk to your care team right away if you have fever, infection, vomiting, diarrhea, or if you sweat a lot while taking this medication. The loss of too much body fluid may make it dangerous for you to take this medication. If you are going to need surgery or a procedure, tell your care team that you are taking this medication. Estrogen and progestin hormones that you take by mouth may not work as well while you are taking this medication. Switch to a non-oral contraceptive or add a barrier contraceptive for 4 weeks after starting this medication and after each dose increase. Talk to your care team about contraceptive options. They can help you find the option that works for you. Do not take this medication without first talking to your care team if you may be or could become pregnant. Your care team can help you find the option that works for you. Weight loss is not recommended during pregnancy. Talk to your care team if you are breastfeeding. When recommended, this medication may be taken. Its use during breastfeeding has not been well studied. Your care team may suggest other options. What side effects may I notice from receiving this medication? Side effects that you should report to your care team as soon as possible: Allergic reactions--skin rash, itching, hives, swelling of the face, lips, tongue, or throat Change in vision Dehydration--increased thirst, dry mouth, feeling faint or lightheaded, headache, dark yellow or brown urine Fast or irregular heartbeat Gallbladder problems--severe stomach pain, nausea, vomiting, fever Kidney injury--decrease in the amount of urine, swelling of the  ankles, hands, or feet Pancreatitis--severe stomach pain that spreads to your back or  gets worse after eating or when touched, fever, nausea, vomiting Thoughts of suicide or self-harm, worsening mood, feelings of depression Thyroid  cancer--new mass or lump in the neck, pain or trouble swallowing, trouble breathing, hoarseness Side effects that usually do not require medical attention (report these to your care team if they continue or are bothersome): Constipation Diarrhea Loss of appetite Nausea Upset stomach This list may not describe all possible side effects. Call your doctor for medical advice about side effects. You may report side effects to FDA at 1-800-FDA-1088. Where should I keep my medication? Keep out of the reach of children and pets. Store in a refrigerator or at room temperature up to 30 degrees C (86 degrees F). Keep it in the original container. Protect from light. Refrigeration (preferred): Store in the refrigerator. Do not freeze. Get rid of any unused medication after the expiration date. Room temperature: This medication may be stored at room temperature for up to 21 days. If it is stored at room temperature, get rid of any unused medication after 21 days or after it expires, whichever is first. To get rid of medications that are no longer needed or have expired: Take the medication to a medication take-back program. Check with your pharmacy or law enforcement to find a location. If you cannot return the medication, ask your pharmacist or care team how to get rid of this medication safely. NOTE: This sheet is a summary. It may not cover all possible information. If you have questions about this medicine, talk to your doctor, pharmacist, or health care provider.  2025 Elsevier/Gold Standard (2023-10-23 00:00:00)  Preventive Care 49-71 Years Old, Female Preventive care refers to lifestyle choices and visits with your health care provider that can promote health and wellness. Preventive care visits are also called wellness exams. What can I expect for my  preventive care visit? Counseling Your health care provider may ask you questions about your: Medical history, including: Past medical problems. Family medical history. Pregnancy history. Current health, including: Menstrual cycle. Method of birth control. Emotional well-being. Home life and relationship well-being. Sexual activity and sexual health. Lifestyle, including: Alcohol, nicotine or tobacco, and drug use. Access to firearms. Diet, exercise, and sleep habits. Work and work Astronomer. Sunscreen use. Safety issues such as seatbelt and bike helmet use. Physical exam Your health care provider will check your: Height and weight. These may be used to calculate your BMI (body mass index). BMI is a measurement that tells if you are at a healthy weight. Waist circumference. This measures the distance around your waistline. This measurement also tells if you are at a healthy weight and may help predict your risk of certain diseases, such as type 2 diabetes and high blood pressure. Heart rate and blood pressure. Body temperature. Skin for abnormal spots. What immunizations do I need?  Vaccines are usually given at various ages, according to a schedule. Your health care provider will recommend vaccines for you based on your age, medical history, and lifestyle or other factors, such as travel or where you work. What tests do I need? Screening Your health care provider may recommend screening tests for certain conditions. This may include: Lipid and cholesterol levels. Diabetes screening. This is done by checking your blood sugar (glucose) after you have not eaten for a while (fasting). Pelvic exam and Pap test. Hepatitis B test. Hepatitis C test. HIV (human immunodeficiency virus) test. STI (sexually transmitted  infection) testing, if you are at risk. Lung cancer screening. Colorectal cancer screening. Mammogram. Talk with your health care provider about when you should start  having regular mammograms. This may depend on whether you have a family history of breast cancer. BRCA-related cancer screening. This may be done if you have a family history of breast, ovarian, tubal, or peritoneal cancers. Bone density scan. This is done to screen for osteoporosis. Talk with your health care provider about your test results, treatment options, and if necessary, the need for more tests. Follow these instructions at home: Eating and drinking  Eat a diet that includes fresh fruits and vegetables, whole grains, lean protein, and low-fat dairy products. Take vitamin and mineral supplements as recommended by your health care provider. Do not drink alcohol if: Your health care provider tells you not to drink. You are pregnant, may be pregnant, or are planning to become pregnant. If you drink alcohol: Limit how much you have to 0-1 drink a day. Know how much alcohol is in your drink. In the [place] one drink equals one 12 oz bottle of beer (355 mL), one 5 oz glass of wine (148 mL), or one 1 oz glass of hard liquor (44 mL). Lifestyle Brush your teeth every morning and night with fluoride toothpaste. Floss one time each day. Exercise for at least 30 minutes 5 or more days each week. Do not use any products that contain nicotine or tobacco. These products include cigarettes, chewing tobacco, and vaping devices, such as e-cigarettes. If you need help quitting, ask your health care provider. Do not use drugs. If you are sexually active, practice safe sex. Use a condom or other form of protection to prevent STIs. If you do not wish to become pregnant, use a form of birth control. If you plan to become pregnant, see your health care provider for a prepregnancy visit. Take aspirin only as told by your health care provider. Make sure that you understand how much to take and what form to take. Work with your health care provider to find out whether it is safe and beneficial for you to take  aspirin daily. Find healthy ways to manage stress, such as: Meditation, yoga, or listening to music. Journaling. Talking to a trusted person. Spending time with friends and family. Minimize exposure to UV radiation to reduce your risk of skin cancer. Safety Always wear your seat belt while driving or riding in a vehicle. Do not drive: If you have been drinking alcohol. Do not ride with someone who has been drinking. When you are tired or distracted. While texting. If you have been using any mind-altering substances or drugs. Wear a helmet and other protective equipment during sports activities. If you have firearms in your house, make sure you follow all gun safety procedures. Seek help if you have been physically or sexually abused. What's next? Visit your health care provider once a year for an annual wellness visit. Ask your health care provider how often you should have your eyes and teeth checked. Stay up to date on all vaccines. This information is not intended to replace advice given to you by your health care provider. Make sure you discuss any questions you have with your health care provider. Document Revised: 03/23/2021 Document Reviewed: 03/23/2021 Elsevier Patient Education  2024 Elsevier Inc.    Signed,   Reyes Pines, MD [facility], [facility] 05/15/24 10:51 AM

## 2024-05-16 ENCOUNTER — Encounter: Payer: Self-pay | Admitting: Family Medicine

## 2024-05-16 LAB — COMPREHENSIVE METABOLIC PANEL WITH GFR
ALT: 26 U/L (ref 0–35)
AST: 23 U/L (ref 0–37)
Albumin: 4.5 g/dL (ref 3.5–5.2)
Alkaline Phosphatase: 81 U/L (ref 39–117)
BUN: 16 mg/dL (ref 6–23)
CO2: 28 meq/L (ref 19–32)
Calcium: 9.3 mg/dL (ref 8.4–10.5)
Chloride: 102 meq/L (ref 96–112)
Creatinine, Ser: 0.84 mg/dL (ref 0.40–1.20)
GFR: 79.04 mL/min (ref >60.00)
Glucose, Bld: 91 mg/dL (ref 70–99)
Potassium: 5.1 meq/L (ref 3.5–5.1)
Sodium: 141 meq/L (ref 135–145)
Total Bilirubin: 0.5 mg/dL (ref 0.2–1.2)
Total Protein: 7.2 g/dL (ref 6.0–8.3)

## 2024-05-16 LAB — LIPID PANEL
Cholesterol: 201 mg/dL — ABNORMAL HIGH (ref 0–200)
HDL: 53.4 mg/dL (ref >39.00)
LDL Cholesterol: 121 mg/dL — ABNORMAL HIGH (ref 0–99)
NonHDL: 148
Total CHOL/HDL Ratio: 4
Triglycerides: 137 mg/dL (ref 0.0–149.0)
VLDL: 27.4 mg/dL (ref 0.0–40.0)

## 2024-05-16 NOTE — Telephone Encounter (Signed)
 Patient letting you know what kind of cancer aunt had. FYI

## 2024-05-20 ENCOUNTER — Ambulatory Visit: Payer: Self-pay | Admitting: Family Medicine

## 2024-05-29 ENCOUNTER — Encounter: Payer: Self-pay | Admitting: Family Medicine

## 2024-05-30 ENCOUNTER — Other Ambulatory Visit (HOSPITAL_COMMUNITY): Payer: Self-pay

## 2024-05-30 ENCOUNTER — Telehealth: Payer: Self-pay

## 2024-05-30 DIAGNOSIS — Z6841 Body Mass Index (BMI) 40.0 and over, adult: Secondary | ICD-10-CM

## 2024-05-30 DIAGNOSIS — G4733 Obstructive sleep apnea (adult) (pediatric): Secondary | ICD-10-CM

## 2024-05-30 MED ORDER — ZEPBOUND 2.5 MG/0.5ML ~~LOC~~ SOLN: 2.5000 mg | SUBCUTANEOUS | 1 refills | Status: DC

## 2024-05-30 NOTE — Telephone Encounter (Signed)
 Zepbound  is not covered by plan, any change to therapy plan needed?

## 2024-05-30 NOTE — Telephone Encounter (Signed)
 Pharmacy Patient Advocate Encounter   Received notification from Patient Advice Request messages that prior authorization for Zepbound  2.5MG /0.5ML pen-injectors is required/requested.   Insurance verification completed.   The patient is insured through [employer] .   Per test claim: PA required and submitted KEY/EOC/Request #: [id number] due to: PLAN EXCLUSION

## 2024-05-30 NOTE — Telephone Encounter (Signed)
 Prescription sent to [facility].  Follow-up in 1 month -virtual visit is fine.  Likely will increase dose at that time.  Let me know if there are questions.

## 2024-05-30 NOTE — Telephone Encounter (Signed)
 Pt has agreed to multi dose vial

## 2024-05-30 NOTE — Addendum Note (Signed)
 Addended by: Angelmarie Ponzo R on: 05/30/2024 03:09 PM   Modules accepted: Orders

## 2024-05-30 NOTE — Telephone Encounter (Signed)
 Unfortunately zepbound  is not covered by her plan.  Unfortunately I thought that it may be covered with sleep apnea indication.  If she would still like to use zepbound  I can send in the multidose vial for out-of-pocket payment let me know if she would like to proceed with that plan and I will send in the meds.

## 2024-05-30 NOTE — Telephone Encounter (Signed)
 Called patient to relay Dr.Greene's note below, Left Vm to return call

## 2024-06-02 NOTE — Telephone Encounter (Signed)
 Made virtual appt in October for follow up on weight loss medication.

## 2024-06-18 ENCOUNTER — Ambulatory Visit: Admitting: Family Medicine

## 2024-06-24 ENCOUNTER — Encounter: Payer: Self-pay | Admitting: Family Medicine

## 2024-06-24 DIAGNOSIS — G4733 Obstructive sleep apnea (adult) (pediatric): Secondary | ICD-10-CM

## 2024-06-24 DIAGNOSIS — E66813 Obesity, class 3: Secondary | ICD-10-CM

## 2024-06-24 NOTE — Telephone Encounter (Signed)
 Per last note you would increase after next appt. Please advise

## 2024-06-25 NOTE — Telephone Encounter (Signed)
 Patient states no side effects and completed 3 weeks of the 2.5mg  dose. Patient states she can wait until her appointment on 10/4 if she needs to discuss treatment plan in depth.

## 2024-06-26 MED ORDER — TIRZEPATIDE-WEIGHT MANAGEMENT 5 MG/0.5ML ~~LOC~~ SOLN: 5.0000 mg | SUBCUTANEOUS | 1 refills | Status: DC

## 2024-07-09 ENCOUNTER — Encounter: Payer: Self-pay | Admitting: Family Medicine

## 2024-07-09 ENCOUNTER — Telehealth: Admitting: Family Medicine

## 2024-07-09 DIAGNOSIS — Z6841 Body Mass Index (BMI) 40.0 and over, adult: Secondary | ICD-10-CM

## 2024-07-09 DIAGNOSIS — E66813 Obesity, class 3: Secondary | ICD-10-CM

## 2024-07-09 NOTE — Progress Notes (Signed)
 Called to schedule appointment, no answer, LM to call back

## 2024-07-09 NOTE — Progress Notes (Signed)
 Virtual Visit via Video Note  I connected with Hannah Buchanan on 07/09/24 at 4:03 PM by a video enabled telemedicine application and verified that I am speaking with the correct person using two identifiers.  Patient location: work - in private room.  My location: office - [place].    I discussed the limitations, risks, security and privacy concerns of performing an evaluation and management service by telephone and the availability of in person appointments. I also discussed with the patient that there may be a patient responsible charge related to this service. The patient expressed understanding and agreed to proceed, consent obtained.   Some difficulty with video connection at the end of her visit, reverted to audio only for last portion, majority of visit over video.  Chief complaint:  Chief Complaint  Patient presents with   Weight Loss    Follow up. Reports doing well. Patient is still having constipation. Tries to have 1 bowel movement a day. Has increased fiber intake, drinks apple juice and 1-2 prunes a day.     History of Present Illness: Hannah Buchanan is a 53 y.o. female  Obesity Follow-up from August 7 physical, weight loss discussed at that time.  She had had difficulty with losing weight on her own, had tried weight loss with Optavia for a year or 2, some weight loss but then regained weight.  Tried increased exercise with minimal benefit and limited by knee issues.  Portion control with minimal improvement in weight.  No history of pancreatitis and no family history of MEN syndrome, or medullary thyroid  cancer.  Requested GLP or GIP to assist with her weight loss.  Decided to start Zepbound , low-dose start initially at 2.5 mg.  Weight 242 last visit.  Zepbound  is working well - only slight nausea at times. Some constipation but managed with prune juice, apple juice. No neck swelling or vomiting. No new abd pain - discomfort with constipation.  2 more weeks of 2.5mg   dose, then has 5mg  to start.   Home weight 235 yesterday. 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   Arthritis    per pt   Depression    Esophageal stricture    GERD (gastroesophageal reflux disease)    Hiatal hernia    Hypertension    Pneumonia    Recurrent cold sores    Status post dilation of esophageal narrowing    Past Surgical History:  Procedure Laterality Date   Admission  10/09/1998   prolonged syncopal event; admission for 24 hours.  [facility]   BLADDER REPAIR  1998   [facility].   BREAST SURGERY  1999   breast reduction   KNEE ARTHROSCOPY Right    10 yrs ago   UPPER GASTROINTESTINAL ENDOSCOPY  2018   Allergies  Allergen Reactions   Gluten Meal Other (See Comments)   Wheat    Prior to Admission medications   Medication Sig Start Date End Date Taking? Authorizing Provider  chlorthalidone  (HYGROTON ) 25 MG tablet Take 1 tablet (25 mg total) by mouth daily. 05/03/23  Yes Levora Reyes SAUNDERS, MD   docusate sodium (COLACE) 100 MG capsule Take 100 mg by mouth as needed for mild constipation. PRN   Yes [provider]  Hyaluronan (ORTHOVISC) 30 MG/2ML SOSY intra-articular injection INJECT THE CONTENTS OF 1 SYRINGE INTRA-ARTICULARLY IN RIGHT KNEE ONCE A WEEK FOR 3 WEEKS 05/06/24  Yes [provider]  loratadine (CLARITIN) 10 MG tablet Take 10 mg by mouth daily.   Yes [provider]  meloxicam (MOBIC) 15 MG tablet 7.5 mg as needed.  05/08/18  Yes [provider]  metoprolol  succinate (TOPROL -XL) 25 MG 24 hr tablet TAKE 1 AND 1/2 TABLETS(37.5 MG) BY MOUTH EVERY DAY 05/15/24  Yes Levora Reyes SAUNDERS, MD  Misc Natural Products (NEURIVA PO)    Yes [provider]  Multiple Vitamin (MULTIVITAMIN) tablet Take 1 tablet by mouth daily.   Yes [provider]  Omega-3 Fatty Acids (FISH OIL PO) Fish Oil 1,200 mg (144 mg-216 mg) capsule   Yes [provider]  Probiotic Product (PROBIOTIC DAILY PO) Take 1 tablet by mouth daily.   Yes [provider]  tirzepatide  5 MG/0.5ML injection vial Inject 5 mg into the skin once a week. 06/26/24  Yes Levora Reyes SAUNDERS, MD  valACYclovir (VALTREX) 500 MG tablet Take 500 mg by mouth as needed.   Yes [provider]  Cholecalciferol (VITAMIN D -3 PO) PRN Patient not taking: Reported on 07/09/2024    [provider]   Social History   Socioeconomic History   Marital status: Married    Spouse name: Not on file   Number of children: 2   Years of education: Not on file   Highest education level: Some college, no degree  Occupational History   Occupation: Manufacturing engineer: [employer]  Tobacco Use   Smoking status: Never   Smokeless tobacco: Never  Vaping Use   Vaping status: Never Used  Substance and Sexual Activity   Alcohol use: Yes    Comment: occasional   Drug use: No   Sexual activity: Yes  Other Topics Concern   Not on file  Social History  Narrative   Marital status: married x 26 years; happily      Chldren: 2 children (22, 25); no grandchldren.      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   Social Drivers of Corporate investment banker Strain: Low Risk  (05/14/2024)   Overall Financial Resource Strain (CARDIA)    Difficulty of Paying Living Expenses: Not hard at all  Food Insecurity: No Food Insecurity (05/14/2024)  Hunger Vital Sign    Worried About Running Out of Food in the Last Year: Never true    Ran Out of Food in the Last Year: Never true  Transportation Needs: No Transportation Needs (05/14/2024)   PRAPARE - Administrator, Civil Service (Medical): No    Lack of Transportation (Non-Medical): No  Physical Activity: Insufficiently Active (05/14/2024)   Exercise Vital Sign    Days of Exercise per Week: 3 days    Minutes of Exercise per Session: 40 min  Stress: No Stress Concern Present (05/14/2024)   [employer] of Occupational Health - Occupational Stress Questionnaire    Feeling of Stress: Only a little  Social Connections: Moderately Integrated (05/14/2024)   Social Connection and Isolation Panel    Frequency of Communication with Friends and Family: More than three times a week    Frequency of Social Gatherings with Friends and Family: Once a week    Attends Religious Services: 1 to 4 times per year    Active Member of [employer] or Organizations: No    Attends Engineer, structural: Not on file    Marital Status: Married  Catering manager Violence: Not on file    Observations/Objective: There were no vitals filed for this visit. Nontoxic appearance on video, euthymic mood, no respiratory distress, all questions answered with understanding of plan expressed.  Assessment and Plan: Class 3  severe obesity with serious comorbidity and body mass index (BMI) of 40.0 to 44.9 in adult, unspecified obesity type (HCC) Improving with use of zepbound  as above.  Minimal side effects with slight nausea, constipation treated with over-the-counter treatments.  Option of MiraLAX discussed, fiber in the diet, fluids in the diet.  Does plan on increasing to the 5 mg dose after her next 2 injections of the 2.5 mg.  Monitor for any increased side effects at that dose with RTC precautions, but if stable we will follow-up in 2 months for virtual visit.  Can decide on further doses changes at that time, should have sufficient 5 mg dose until that visit.  In person visit can be after her follow-up to monitor her weight but I am okay with a virtual visit in 2 months initially.  RTC precautions given, all questions answered.  Follow Up Instructions: 2 months virtual visit.   I discussed the assessment and treatment plan with the patient. The patient was provided an opportunity to ask questions and all were answered. The patient agreed with the plan and demonstrated an understanding of the instructions.   The patient was advised to call back or seek an in-person evaluation if the symptoms worsen or if the condition fails to improve as anticipated.   Reyes JONELLE Pines, MD

## 2024-07-09 NOTE — Patient Instructions (Addendum)
 Great talking to you today.  Glad to hear that you are losing weight with Zepbound  and overall side effects have been minimal.  Continue fiber in the diet, MiraLAX if needed for more significant constipation but let me know if any new or worsening side effects, especially as you increase to the 5 mg dose.  Can recheck for virtual visit in 2 months but let me know if there are any questions or concerns in the meantime. Take care.

## 2024-07-10 ENCOUNTER — Telehealth: Payer: Self-pay

## 2024-07-10 NOTE — Progress Notes (Signed)
 LM to scheduled 2 month F/U virtually

## 2024-07-10 NOTE — Telephone Encounter (Signed)
-----   Message from Reyes JONELLE Pines sent at 07/09/2024  4:22 PM EDT ----- 77-month recheck.  Virtual visit is fine.

## 2024-07-10 NOTE — Telephone Encounter (Signed)
 Called patient to schedule 2 month virtual visit recheck obesity, zepbound . Left vm to return call

## 2024-07-10 NOTE — Progress Notes (Signed)
 Called to schedule, no answer, LM to call back

## 2024-07-15 DIAGNOSIS — K047 Periapical abscess without sinus: Secondary | ICD-10-CM | POA: Insufficient documentation

## 2024-07-16 ENCOUNTER — Other Ambulatory Visit: Payer: Self-pay | Admitting: Family Medicine

## 2024-07-16 DIAGNOSIS — G4733 Obstructive sleep apnea (adult) (pediatric): Secondary | ICD-10-CM

## 2024-07-16 DIAGNOSIS — E66813 Obesity, class 3: Secondary | ICD-10-CM

## 2024-08-31 ENCOUNTER — Other Ambulatory Visit: Payer: Self-pay | Admitting: Family Medicine

## 2024-08-31 DIAGNOSIS — I10 Essential (primary) hypertension: Secondary | ICD-10-CM

## 2024-09-08 ENCOUNTER — Other Ambulatory Visit

## 2024-09-08 ENCOUNTER — Telehealth: Admitting: Family Medicine

## 2024-09-08 DIAGNOSIS — G4733 Obstructive sleep apnea (adult) (pediatric): Secondary | ICD-10-CM | POA: Diagnosis not present

## 2024-09-08 DIAGNOSIS — R1012 Left upper quadrant pain: Secondary | ICD-10-CM

## 2024-09-08 DIAGNOSIS — Z6841 Body Mass Index (BMI) 40.0 and over, adult: Secondary | ICD-10-CM | POA: Diagnosis not present

## 2024-09-08 DIAGNOSIS — E66813 Obesity, class 3: Secondary | ICD-10-CM | POA: Diagnosis not present

## 2024-09-08 LAB — CBC
HCT: 38.4 % (ref 36.0–46.0)
Hemoglobin: 13.1 g/dL (ref 12.0–15.0)
MCHC: 34.2 g/dL (ref 30.0–36.0)
MCV: 88.1 fl (ref 78.0–100.0)
Platelets: 193 K/uL (ref 150.0–400.0)
RBC: 4.36 Mil/uL (ref 3.87–5.11)
RDW: 13.2 % (ref 11.5–15.5)
WBC: 6.6 K/uL (ref 4.0–10.5)

## 2024-09-08 NOTE — Progress Notes (Signed)
 Virtual Visit via Video Note  I connected with Hannah Buchanan on 09/08/24 at 3:23 PM by a video enabled telemedicine application and verified that I am speaking with the correct person using two identifiers.  Patient location: home - by self.  My location: office - [place].    I discussed the limitations, risks, security and privacy concerns of performing an evaluation and management service by telephone and the availability of in person appointments. I also discussed with the patient that there may be a patient responsible charge related to this service. The patient expressed understanding and agreed to proceed, consent obtained  Chief complaint:  Chief Complaint  Patient presents with   Follow-up    2 month follow up on Zepbound . Patient is doing okay. Saturday she started have LUQ abdominal pain. She said that today is better than she was a few days ago. She did not take her injection Sunday because of the pain.     History of Present Illness: Hannah Buchanan is a 53 y.o. female  Class III obesity Follow-up from October 1 visit.  Weight loss has been difficult in the past on her own even with using Optavia, exercise, dietary changes and portion control.  Ultimately decided to try medication with zepbound .  Initially 2.5 mg.  Slight nausea at times intermittently when discussed at October 1 visit, constipation managed with prune juice and apple juice.  She had 2 additional weeks of the starting dose at that time then plan to start 5 mg. Has been on 5 mg dose zepbound  since last visit. 2 or 3 doses. About the same constipation at higher dose. No nausea.  Had left upper abdominal pain when she woke up Saturday am - 2 days ago. Able to eat and drink ok. Pain eased off, possible gas. Did have more beans, and some increased gas, more of a hard stool on Saturday.  No n/v/fever. No skin changes, no redness.  Only slight soreness - has improved. Did not inject dose of zepbound  as usual  yesterday.  Last BM this am. Tx: prunes, apple juice, stool softener, occasional senna tablet.   Home weight 220 today. Initial home weight 247.  Feeling well otherwise.   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   Arthritis    per pt   Depression    Esophageal stricture    GERD (gastroesophageal reflux disease)    Hiatal hernia    Hypertension    Pneumonia    Recurrent cold  sores    Status post dilation of esophageal narrowing    Past Surgical History:  Procedure Laterality Date   Admission  10/09/1998   prolonged syncopal event; admission for 24 hours.  [facility]   BLADDER REPAIR  1998   [facility]ne[facility].   BREAST SURGERY  1999   breast reduction   KNEE ARTHROSCOPY Right    10 yrs ago   UPPER GASTROINTESTINAL ENDOSCOPY  2018   Allergies  Allergen Reactions   Gluten Meal Other (See Comments)   Wheat    Prior to Admission medications   Medication Sig Start Date End Date Taking? Authorizing Provider  chlorthalidone  (HYGROTON ) 25 MG  tablet Take 1 tablet (25 mg total) by mouth daily. 05/03/23  Yes Levora Reyes SAUNDERS, MD  Cholecalciferol (VITAMIN D -3 PO) PRN   Yes [provider]  docusate sodium (COLACE) 100 MG capsule Take 100 mg by mouth as needed for mild constipation. PRN   Yes [provider]  Hyaluronan (ORTHOVISC) 30 MG/2ML SOSY intra-articular injection INJECT THE CONTENTS OF 1 SYRINGE INTRA-ARTICULARLY IN RIGHT KNEE ONCE A WEEK FOR 3 WEEKS Patient taking differently: Inject into the articular space once. Once every 6 months as needed 05/06/24  Yes [provider]  loratadine (CLARITIN) 10 MG tablet Take 10 mg by mouth daily.   Yes [provider]  meloxicam (MOBIC) 15 MG tablet 7.5 mg as needed.  05/08/18  Yes [provider]  metoprolol  succinate (TOPROL -XL) 25 MG 24 hr tablet TAKE 1 AND 1/2 TABLETS(37.5 MG) BY MOUTH EVERY DAY 09/01/24  Yes Levora Reyes SAUNDERS, MD  Misc Natural Products (NEURIVA PO)    Yes [provider]  Multiple Vitamin (MULTIVITAMIN) tablet Take 1 tablet by mouth daily.   Yes [provider]  Omega-3 Fatty Acids (FISH OIL PO) Fish Oil 1,200 mg (144 mg-216 mg) capsule   Yes [provider]  Probiotic Product (PROBIOTIC DAILY PO) Take 1 tablet by mouth daily.   Yes [provider]  tirzepatide  5 MG/0.5ML injection vial Inject 5 mg into the skin once a week. 06/26/24  Yes Levora Reyes SAUNDERS, MD  valACYclovir (VALTREX) 500 MG tablet Take 500 mg by mouth as needed.   Yes [provider]   Social History   Socioeconomic History   Marital status: Married    Spouse name: Not on file   Number of children: 2   Years of education: Not on file   Highest education level: Some college, no degree  Occupational History   Occupation: Manufacturing Engineer: [employer]  Tobacco Use   Smoking status: Never   Smokeless tobacco: Never  Vaping Use   Vaping status: Never Used  Substance and Sexual Activity    Alcohol use: Yes    Comment: occasional   Drug use: No   Sexual activity: Yes  Other Topics Concern   Not on file  Social History Narrative   Marital status: married x 26 years; happily      Chldren: 2 children (22, 25); no grandchldren.      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   Social Drivers of Corporate Investment Banker  Strain: Low Risk  (09/08/2024)   Overall Financial Resource Strain (CARDIA)    Difficulty of Paying Living Expenses: Not hard at all  Food Insecurity: No Food Insecurity (09/08/2024)   Hunger Vital Sign    Worried About Running Out of Food in the Last Year: Never true    Ran Out of Food in the Last Year: Never true  Transportation Needs: No Transportation Needs (09/08/2024)   PRAPARE - Administrator, Civil Service (Medical): No    Lack of Transportation (Non-Medical): No  Physical Activity: Sufficiently Active (09/08/2024)   Exercise Vital Sign    Days of Exercise per Week: 5 days    Minutes of Exercise per Session: 30 min  Stress: No Stress Concern Present (09/08/2024)   [employer] of Occupational Health - Occupational Stress Questionnaire    Feeling of Stress: Only a little  Social Connections: Moderately Integrated (09/08/2024)   Social Connection and Isolation Panel    Frequency of Communication with Friends and Family: Three times a week    Frequency of Social Gatherings with Friends and Family: Once a week    Attends Religious Services: Patient declined    Database Administrator or Organizations: Yes    Attends Engineer, Structural: More than 4 times per year    Marital Status: Married  Catering Manager Violence: Not on file    Observations/Objective: There were no vitals filed for this visit. Nontoxic appearance on  video.  Speaking in full sentences, no respiratory distress.  Appropriate responses.  All questions answered with understanding of plan expressed.  Assessment and Plan: LUQ abdominal pain - Plan: Lipase, CBC, Comprehensive metabolic panel with GFR  Class 3 severe obesity with serious comorbidity and body mass index (BMI) of 40.0 to 44.9 in adult, unspecified obesity type (HCC)  OSA (obstructive sleep apnea) Episode of left upper quadrant abdominal pain 2 days ago which has improved.  Increased pains, increased gas may have been contributing, history of constipation may also been contributing.  However with use of GLP or GIP medication concern for possible pancreatitis and will check lipase, CBC, CMP.  She did not dose yesterday when due, hold on further doses for now until further evaluation with labs above.  If elevated lipase then would recommend CT imaging to eval for pancreatitis.  ER precautions given but as improving we will continue to monitor from home for now.   In regards to obesity treatment, otherwise tolerated zepbound  as above with continued weight loss.  If pancreas testing above and labs are reassuring, symptoms resolved, consider restart of zepbound  in the next week.  Follow Up Instructions:    I discussed the assessment and treatment plan with the patient. The patient was provided an opportunity to ask questions and all were answered. The patient agreed with the plan and demonstrated an understanding of the instructions.   The patient was advised to call back or seek an in-person evaluation if the symptoms worsen or if the condition fails to improve as anticipated.   Reyes JONELLE Pines, MD

## 2024-09-08 NOTE — Patient Instructions (Signed)
 As we discussed there could be various causes of your abdominal discomfort on Saturday.  Constipation or is a possibility but with use of GIP or GLP-1 medications we do have to make sure this was not a pancreatitis.  Please have labs at the [facility] location below.  Hold on further doses of zepbound  until we have more information.  Since symptoms are improving today, continue to monitor at home and be seen if any new or worsening symptoms.

## 2024-09-09 ENCOUNTER — Ambulatory Visit: Payer: Self-pay | Admitting: Family Medicine

## 2024-09-09 ENCOUNTER — Other Ambulatory Visit: Payer: Self-pay | Admitting: Family Medicine

## 2024-09-09 DIAGNOSIS — R748 Abnormal levels of other serum enzymes: Secondary | ICD-10-CM

## 2024-09-09 DIAGNOSIS — R1012 Left upper quadrant pain: Secondary | ICD-10-CM

## 2024-09-09 LAB — COMPREHENSIVE METABOLIC PANEL WITH GFR
ALT: 20 U/L (ref 0–35)
AST: 18 U/L (ref 0–37)
Albumin: 4.5 g/dL (ref 3.5–5.2)
Alkaline Phosphatase: 72 U/L (ref 39–117)
BUN: 16 mg/dL (ref 6–23)
CO2: 30 meq/L (ref 19–32)
Calcium: 9.5 mg/dL (ref 8.4–10.5)
Chloride: 102 meq/L (ref 96–112)
Creatinine, Ser: 0.86 mg/dL (ref 0.40–1.20)
GFR: 76.66 mL/min (ref >60.00)
Glucose, Bld: 96 mg/dL (ref 70–99)
Potassium: 3.9 meq/L (ref 3.5–5.1)
Sodium: 140 meq/L (ref 135–145)
Total Bilirubin: 0.5 mg/dL (ref 0.2–1.2)
Total Protein: 7.1 g/dL (ref 6.0–8.3)

## 2024-09-09 LAB — LIPASE: Lipase: 98 U/L — ABNORMAL HIGH (ref 11.0–59.0)

## 2024-09-09 NOTE — Progress Notes (Signed)
 See ov, labs. Ordered CT to r/o signs of pancreatitis.

## 2024-09-10 ENCOUNTER — Inpatient Hospital Stay: Admission: RE | Admit: 2024-09-10 | Discharge: 2024-09-10 | Attending: Family Medicine | Admitting: Family Medicine

## 2024-09-10 ENCOUNTER — Ambulatory Visit: Payer: Self-pay | Admitting: Family Medicine

## 2024-09-10 DIAGNOSIS — R748 Abnormal levels of other serum enzymes: Secondary | ICD-10-CM

## 2024-09-10 DIAGNOSIS — R1012 Left upper quadrant pain: Secondary | ICD-10-CM

## 2024-09-10 MED ORDER — IOPAMIDOL (ISOVUE-300) INJECTION 61%
100.0000 mL | Freq: Once | INTRAVENOUS | Status: AC | PRN
  Administered 2024-09-10: 100 mL via INTRAVENOUS

## 2024-09-11 ENCOUNTER — Other Ambulatory Visit: Payer: Self-pay

## 2024-09-11 DIAGNOSIS — R1012 Left upper quadrant pain: Secondary | ICD-10-CM

## 2024-09-11 DIAGNOSIS — R748 Abnormal levels of other serum enzymes: Secondary | ICD-10-CM

## 2024-09-11 NOTE — Progress Notes (Signed)
 Ordered future labs  Called patient and left vm to return call. Patient needs to schedule lab only visit. Please schedule if she calls back

## 2024-09-12 ENCOUNTER — Ambulatory Visit: Admitting: Family Medicine

## 2024-09-12 NOTE — Telephone Encounter (Unsigned)
 Copied from CRM [id number]. Topic: Clinical - Lab/Test Results >> Sep 12, 2024  2:58 PM [place] J wrote: Reason for CRM: Patient is returning a call from [place] for imaging results.  Please call back [phone].

## 2024-09-15 NOTE — Progress Notes (Signed)
 Patient is scheduled for labs 12/12. She will go from there

## 2024-09-15 NOTE — Telephone Encounter (Signed)
 Please call patient. Tirzepatide  has been known to increase lipase levels, but they are still typically within the normal range. Rarely they can be above normal. However with recent abdominal pain, it is difficult to say whether or not this was just a rare increase of lipase  above normal range without medication or a mild pancreatitis.  I would like to see it return to normal on lab visit, then can decide if we restart the tirzepatide  with close monitoring for any return of abdominal discomfort and if that occurred would have to avoid it in the future.  Let me know her thoughts.

## 2024-09-19 ENCOUNTER — Other Ambulatory Visit

## 2024-09-19 DIAGNOSIS — R1012 Left upper quadrant pain: Secondary | ICD-10-CM

## 2024-09-19 DIAGNOSIS — R748 Abnormal levels of other serum enzymes: Secondary | ICD-10-CM

## 2024-09-20 LAB — LIPASE: Lipase: 252 U/L — ABNORMAL HIGH (ref 7–60)

## 2024-09-22 NOTE — Telephone Encounter (Signed)
 Patient is messaging in wanting to repeat lipid panel labs as she did not fast for this in her message below explaining.

## 2024-09-23 ENCOUNTER — Ambulatory Visit: Payer: Self-pay | Admitting: Family Medicine

## 2024-09-26 NOTE — Addendum Note (Signed)
 Addended by: Marvis Saefong R on: 09/26/2024 02:31 PM   Modules accepted: Orders

## 2024-10-20 ENCOUNTER — Other Ambulatory Visit (INDEPENDENT_AMBULATORY_CARE_PROVIDER_SITE_OTHER)

## 2024-10-20 DIAGNOSIS — R748 Abnormal levels of other serum enzymes: Secondary | ICD-10-CM | POA: Diagnosis not present

## 2024-10-20 LAB — LIPASE: Lipase: 26 U/L (ref 11.0–59.0)

## 2024-10-26 ENCOUNTER — Ambulatory Visit: Payer: Self-pay | Admitting: Family Medicine

## 2024-10-26 DIAGNOSIS — G4733 Obstructive sleep apnea (adult) (pediatric): Secondary | ICD-10-CM

## 2024-10-26 DIAGNOSIS — Z6841 Body Mass Index (BMI) 40.0 and over, adult: Secondary | ICD-10-CM

## 2024-10-26 DIAGNOSIS — R748 Abnormal levels of other serum enzymes: Secondary | ICD-10-CM

## 2024-10-27 NOTE — Telephone Encounter (Signed)
Patient responded.

## 2024-10-28 MED ORDER — ZEPBOUND 2.5 MG/0.5ML ~~LOC~~ SOAJ: 2.5000 mg | SUBCUTANEOUS | 1 refills | Status: AC

## 2024-10-28 NOTE — Telephone Encounter (Signed)
 Future lab ordered.  Patient will need to schedule lab appt 2 weeks after starting medication.

## 2024-11-05 MED ORDER — TIRZEPATIDE-WEIGHT MANAGEMENT 2.5 MG/0.5ML ~~LOC~~ SOLN: 2.5000 mg | SUBCUTANEOUS | 1 refills | Status: AC

## 2024-11-05 NOTE — Addendum Note (Signed)
 Addended by: HONOR BERN A on: 11/05/2024 09:18 AM   Modules accepted: Orders
